# Patient Record
Sex: Female | Born: 1955 | Race: Black or African American | Hispanic: No | Smoking: Former smoker
Health system: Southern US, Community
[De-identification: ages and names within clinical notes are randomized; demographics above are authoritative.]

## PROBLEM LIST (undated history)

## (undated) DIAGNOSIS — I639 Cerebral infarction, unspecified: Secondary | ICD-10-CM

## (undated) DIAGNOSIS — E119 Type 2 diabetes mellitus without complications: Secondary | ICD-10-CM

## (undated) DIAGNOSIS — I10 Essential (primary) hypertension: Secondary | ICD-10-CM

## (undated) HISTORY — PX: TUBAL LIGATION: SHX77

---

## 2008-12-09 ENCOUNTER — Inpatient Hospital Stay: Payer: Self-pay | Admitting: Internal Medicine

## 2009-01-05 ENCOUNTER — Ambulatory Visit: Payer: Self-pay | Admitting: Internal Medicine

## 2009-01-12 ENCOUNTER — Other Ambulatory Visit: Payer: Self-pay | Admitting: Internal Medicine

## 2009-01-19 ENCOUNTER — Ambulatory Visit: Payer: Self-pay | Admitting: Internal Medicine

## 2009-09-08 ENCOUNTER — Emergency Department: Payer: Self-pay | Admitting: Emergency Medicine

## 2013-12-19 ENCOUNTER — Inpatient Hospital Stay: Payer: Self-pay | Admitting: Internal Medicine

## 2013-12-19 LAB — COMPREHENSIVE METABOLIC PANEL
ALT: 26 U/L
Albumin: 3.6 g/dL (ref 3.4–5.0)
Alkaline Phosphatase: 107 U/L
Anion Gap: 4 — ABNORMAL LOW (ref 7–16)
BILIRUBIN TOTAL: 0.3 mg/dL (ref 0.2–1.0)
BUN: 9 mg/dL (ref 7–18)
CO2: 29 mmol/L (ref 21–32)
Calcium, Total: 8.9 mg/dL (ref 8.5–10.1)
Chloride: 107 mmol/L (ref 98–107)
Creatinine: 0.78 mg/dL (ref 0.60–1.30)
EGFR (African American): >60
EGFR (Non-African Amer.): >60
GLUCOSE: 138 mg/dL — AB (ref 65–99)
Osmolality: 280 (ref 275–301)
Potassium: 4.2 mmol/L (ref 3.5–5.1)
SGOT(AST): 25 U/L (ref 15–37)
Sodium: 140 mmol/L (ref 136–145)
TOTAL PROTEIN: 7.9 g/dL (ref 6.4–8.2)

## 2013-12-19 LAB — URINALYSIS, COMPLETE
Bilirubin,UR: NEGATIVE
Glucose,UR: NEGATIVE mg/dL (ref 0–75)
Hyaline Cast: 5
KETONE: NEGATIVE
Nitrite: NEGATIVE
PH: 5 (ref 4.5–8.0)
PROTEIN: NEGATIVE
RBC,UR: 45 /HPF (ref 0–5)
Specific Gravity: 1.02 (ref 1.003–1.030)
Squamous Epithelial: 17

## 2013-12-19 LAB — CBC
HCT: 40.3 % (ref 35.0–47.0)
HGB: 13.2 g/dL (ref 12.0–16.0)
MCH: 31.9 pg (ref 26.0–34.0)
MCHC: 32.6 g/dL (ref 32.0–36.0)
MCV: 98 fL (ref 80–100)
Platelet: 197 10*3/uL (ref 150–440)
RBC: 4.12 10*6/uL (ref 3.80–5.20)
RDW: 13.4 % (ref 11.5–14.5)
WBC: 5.2 10*3/uL (ref 3.6–11.0)

## 2013-12-19 LAB — TROPONIN I: Troponin-I: <0.02 ng/mL

## 2013-12-20 ENCOUNTER — Ambulatory Visit: Payer: Self-pay | Admitting: Neurology

## 2013-12-20 LAB — CBC WITH DIFFERENTIAL/PLATELET
Basophil #: 0.1 10*3/uL (ref 0.0–0.1)
Basophil %: 1.3 %
EOS ABS: 0.1 10*3/uL (ref 0.0–0.7)
EOS PCT: 1.7 %
HCT: 36 % (ref 35.0–47.0)
HGB: 11.9 g/dL — ABNORMAL LOW (ref 12.0–16.0)
LYMPHS ABS: 1.6 10*3/uL (ref 1.0–3.6)
LYMPHS PCT: 30.5 %
MCH: 31.9 pg (ref 26.0–34.0)
MCHC: 33 g/dL (ref 32.0–36.0)
MCV: 97 fL (ref 80–100)
Monocyte #: 0.6 x10 3/mm (ref 0.2–0.9)
Monocyte %: 10.4 %
NEUTROS PCT: 56.1 %
Neutrophil #: 3 10*3/uL (ref 1.4–6.5)
Platelet: 186 10*3/uL (ref 150–440)
RBC: 3.72 10*6/uL — AB (ref 3.80–5.20)
RDW: 13.5 % (ref 11.5–14.5)
WBC: 5.4 10*3/uL (ref 3.6–11.0)

## 2013-12-20 LAB — LIPID PANEL
Cholesterol: 203 mg/dL — ABNORMAL HIGH (ref 0–200)
HDL Cholesterol: 41 mg/dL (ref 40–60)
Ldl Cholesterol, Calc: 135 mg/dL — ABNORMAL HIGH (ref 0–100)
Triglycerides: 134 mg/dL (ref 0–200)
VLDL CHOLESTEROL, CALC: 27 mg/dL (ref 5–40)

## 2013-12-20 LAB — BASIC METABOLIC PANEL
Anion Gap: 8 (ref 7–16)
BUN: 9 mg/dL (ref 7–18)
CALCIUM: 8.6 mg/dL (ref 8.5–10.1)
CO2: 27 mmol/L (ref 21–32)
Chloride: 107 mmol/L (ref 98–107)
Creatinine: 0.77 mg/dL (ref 0.60–1.30)
EGFR (African American): >60
EGFR (Non-African Amer.): >60
GLUCOSE: 144 mg/dL — AB (ref 65–99)
OSMOLALITY: 284 (ref 275–301)
POTASSIUM: 3.5 mmol/L (ref 3.5–5.1)
SODIUM: 142 mmol/L (ref 136–145)

## 2013-12-20 LAB — TSH: Thyroid Stimulating Horm: 2.4 u[IU]/mL

## 2013-12-20 LAB — HEMOGLOBIN A1C: HEMOGLOBIN A1C: 7.6 % — AB (ref 4.2–6.3)

## 2013-12-21 LAB — CBC WITH DIFFERENTIAL/PLATELET
Basophil #: 0.1 10*3/uL (ref 0.0–0.1)
Basophil %: 0.9 %
Eosinophil #: 0.1 10*3/uL (ref 0.0–0.7)
Eosinophil %: 1.2 %
HCT: 37.9 % (ref 35.0–47.0)
HGB: 12.3 g/dL (ref 12.0–16.0)
LYMPHS ABS: 1.4 10*3/uL (ref 1.0–3.6)
Lymphocyte %: 24.3 %
MCH: 31.7 pg (ref 26.0–34.0)
MCHC: 32.5 g/dL (ref 32.0–36.0)
MCV: 98 fL (ref 80–100)
MONOS PCT: 10.7 %
Monocyte #: 0.6 x10 3/mm (ref 0.2–0.9)
Neutrophil #: 3.7 10*3/uL (ref 1.4–6.5)
Neutrophil %: 62.9 %
Platelet: 185 10*3/uL (ref 150–440)
RBC: 3.89 10*6/uL (ref 3.80–5.20)
RDW: 13.8 % (ref 11.5–14.5)
WBC: 5.8 10*3/uL (ref 3.6–11.0)

## 2013-12-21 LAB — BASIC METABOLIC PANEL
Anion Gap: 8 (ref 7–16)
BUN: 12 mg/dL (ref 7–18)
Calcium, Total: 8.5 mg/dL (ref 8.5–10.1)
Chloride: 110 mmol/L — ABNORMAL HIGH (ref 98–107)
Co2: 24 mmol/L (ref 21–32)
Creatinine: 0.77 mg/dL (ref 0.60–1.30)
EGFR (Non-African Amer.): >60
Glucose: 137 mg/dL — ABNORMAL HIGH (ref 65–99)
Osmolality: 285 (ref 275–301)
POTASSIUM: 3.8 mmol/L (ref 3.5–5.1)
Sodium: 142 mmol/L (ref 136–145)

## 2013-12-21 LAB — URINE CULTURE

## 2013-12-21 LAB — CLOSTRIDIUM DIFFICILE(ARMC)

## 2013-12-24 LAB — CULTURE, BLOOD (SINGLE)

## 2014-01-03 DIAGNOSIS — R4701 Aphasia: Secondary | ICD-10-CM | POA: Insufficient documentation

## 2014-01-03 DIAGNOSIS — R531 Weakness: Secondary | ICD-10-CM | POA: Insufficient documentation

## 2014-01-27 ENCOUNTER — Encounter: Payer: Self-pay | Admitting: Nurse Practitioner

## 2014-02-21 ENCOUNTER — Encounter: Payer: Self-pay | Admitting: Nurse Practitioner

## 2014-03-24 ENCOUNTER — Encounter: Payer: Self-pay | Admitting: Nurse Practitioner

## 2014-04-22 ENCOUNTER — Encounter
Admit: 2014-04-22 | Disposition: A | Discharge status: Home / Self Care | Payer: Self-pay | Attending: Nurse Practitioner | Admitting: Nurse Practitioner

## 2014-05-23 ENCOUNTER — Encounter
Admit: 2014-05-23 | Disposition: A | Discharge status: Home / Self Care | Payer: Self-pay | Attending: Nurse Practitioner | Admitting: Nurse Practitioner

## 2014-05-26 ENCOUNTER — Emergency Department
Admit: 2014-05-26 | Disposition: A | Discharge status: Home / Self Care | Payer: Self-pay | Admitting: Emergency Medicine

## 2014-05-26 LAB — COMPREHENSIVE METABOLIC PANEL
Albumin: 3.7 g/dL
Alkaline Phosphatase: 102 U/L
Anion Gap: 7 (ref 7–16)
BUN: 22 mg/dL — ABNORMAL HIGH
Bilirubin,Total: 0.4 mg/dL
CALCIUM: 9.1 mg/dL
CO2: 27 mmol/L
Chloride: 104 mmol/L
Creatinine: 0.7 mg/dL
Glucose: 107 mg/dL — ABNORMAL HIGH
Potassium: 3.6 mmol/L
SGOT(AST): 27 U/L
SGPT (ALT): 27 U/L
SODIUM: 138 mmol/L
Total Protein: 7.2 g/dL

## 2014-05-26 LAB — CBC WITH DIFFERENTIAL/PLATELET
BASOS ABS: 0 10*3/uL (ref 0.0–0.1)
BASOS PCT: 0.7 %
EOS ABS: 0.1 10*3/uL (ref 0.0–0.7)
Eosinophil %: 1.4 %
HCT: 34.4 % — ABNORMAL LOW (ref 35.0–47.0)
HGB: 11.7 g/dL — ABNORMAL LOW (ref 12.0–16.0)
LYMPHS PCT: 30.9 %
Lymphocyte #: 1.9 10*3/uL (ref 1.0–3.6)
MCH: 31.9 pg (ref 26.0–34.0)
MCHC: 33.8 g/dL (ref 32.0–36.0)
MCV: 94 fL (ref 80–100)
MONO ABS: 0.9 x10 3/mm (ref 0.2–0.9)
Monocyte %: 14.6 %
NEUTROS PCT: 52.4 %
Neutrophil #: 3.2 10*3/uL (ref 1.4–6.5)
PLATELETS: 204 10*3/uL (ref 150–440)
RBC: 3.65 10*6/uL — ABNORMAL LOW (ref 3.80–5.20)
RDW: 14.5 % (ref 11.5–14.5)
WBC: 6 10*3/uL (ref 3.6–11.0)

## 2014-05-26 LAB — TSH: THYROID STIMULATING HORM: 1.155 u[IU]/mL

## 2014-06-14 NOTE — Consult Note (Signed)
PATIENT NAME:  Terri Burnett, Terri M MR#:  [id number] DATE OF BIRTH:  1955-12-22  DATE OF CONSULTATION:  12/20/2013  REFERRING PHYSICIAN:    CONSULTING PHYSICIAN:  Pauletta BrownsYuriy Ryliegh Mcduffey, MD  REASON FOR CONSULTATION: Stroke.   HISTORY OF PRESENT ILLNESS: This is a 59 year old female with past medical history of hypertension, hyperlipidemia, noncompliant with her medications for the past year; as per patient she is noncompliant with medications because she states she did not think she needs them.   Admitted to the Emergency Department with right upper extremity, right lower extremity weakness that has improved; the patient is status post imaging. She has left lentiform nucleus and the left frontal lobe infarct. The patient was also found to have significant stenosis of the left M1.   PAST MEDICAL HISTORY: Diabetes, history of TIA, anemia, hypertension, history of fibroids, history of hyperlipidemia.   PAST SURGICAL HISTORY: None.   ALLERGIES: NONE.   MEDICATIONS: None.   SOCIAL HISTORY: Smokes about 1/2 pack a day.   REVIEW OF SYSTEMS: No shortness of breath. No abdominal pain. No visual changes. No chest pain. No heat or cold intolerance. No diarrhea or constipation; positive weakness on the right side. No anxiety or depression.   PHYSICAL EXAMINATION: VITAL SIGNS: Include a temperature of 98.9, pulse 77, respirations 18, blood pressure 177/108.  NEUROLOGIC: The patient is alert, awake, oriented to time, place, location and the reason why she is in the hospital. Facial sensation intact; speech appears to be fluent. Tongue is midline. Uvula elevates symmetrically. Shoulder shrug is intact. Motor strength examination, the patient does have a drift on the right upper extremity, which is 4+/5, and slightly on the right lower extremity is 4+/5, as this patient has significantly improved. Coordination, finger-to-nose intact, sensation is intact, reflexes diminished throughout, gait could not be  assessed.   IMPRESSION: A 59 year old female with past medical history of hypertension, hyperlipidemia, noncompliant with her medications, presenting with right upper and right lower extremity weakness, found to have left lentiform nucleus and left frontal lobe infarct.   PLAN: Continue the statin 40 mg daily. The patient is currently, aspirin 81 mg due to proximal left M1 stenosis, based on Sammpris trial, agree with dual antiplatelet therapy for 90 days, aspirin 81 mg, and Plavix 75. After 90 days patient should just continue on 1 agent of Plavix 75 mg a day; if she is able to afford it, if she has insurance; otherwise will consider just on aspirin 325 daily. This case discussed with the patient and the patient's nieces at bedside, rest of the stroke work-up as ordered.   Thank you. It was a pleasure seeing this patient. Please call with any questions.     ____________________________ Pauletta BrownsYuriy Matha Masse, MD yz:nt D: 12/20/2013 17:06:08 ET T: 12/20/2013 19:08:24 ET JOB#: [id number]  cc: Pauletta BrownsYuriy Tiersa Dayley, MD, <Dictator> Pauletta BrownsYURIY Alixandria Friedt MD ELECTRONICALLY SIGNED 12/25/2013 12:31

## 2014-06-14 NOTE — Discharge Summary (Signed)
PATIENT NAME:  Terri Burnett, Terri Burnett MR#:  [id number] DATE OF BIRTH:  08/05/1955  DATE OF ADMISSION:  12/19/2013 DATE OF DISCHARGE:  12/23/2013  DISCHARGE DIAGNOSES: 1.  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  2.  Intracranial artery stenosis.  3.  Hyperlipidemia.   SECONDARY DIAGNOSES: 1.  Diabetes.  2.  Hypertension.  3.  History of transient ischemic attack.  4.  Anemia.  5.  History of fibroids.  6.  Hyperlipidemia.   CONSULTATIONS:   1.  Neurology, Dr. Loretha Brasil.  2.  Physical and occupational therapy.   PROCEDURES AND RADIOLOGY:  CT scan of the head without contrast on October 29 showed acute to subacute infarct involving the left basal ganglia and anterior limb of the internal capsule. No evidence of hemorrhage or mass effect. Old lacunar infarct involving the right thalamus. Old left cerebellar infarct also noted.   MRI of the brain without contrast on October 30 showed high-grade stenosis of the distal left M1 segment corresponding to the areas of infection. High-grade stenosis or occlusion of the left A1 segment. Occluded left vertebral artery. Narrowing of the distal basilar artery of less than 50%. High-grade stenosis of the left P2 segment. Asymmetric attenuation of the left MCA branch vessels suggestive of significant hemodynamic effect of the left M1 stenosis. Moderate diffuse small vessel disease.   Acute nonhemorrhagic infarct involving the anterior lentiform nucleus and anterior left frontal cortex. Remote infarct in the left cerebellum and bilateral thalami.   Bilateral carotid Dopplers on October 30 showed soft plaque within the distal left common carotid artery. No focal hemodynamically significant stenosis.   A 2-D echocardiogram on October 30 showed LVEF of 60 to 65%. Impaired relaxation pattern of LV diastolic filling. Mild LVH. Mild increase in left ventricular posterior wall thickness.   MAJOR LABORATORY PANEL:   Urinalysis on admission showed 1+ bacteria, 89 WBCs, 3+ leukocyte esterase, and WBC in clumps. Blood cultures x 2 were negative on October 29. Urine culture was negative. Stool for Clostridium difficile was negative.   HISTORY AND SHORT HOSPITAL COURSE:  The patient is a 59 year old female with the above-mentioned medical problems, who was admitted for unsteady gait and confusion, worrisome for acute CVA. Please see Dr. Wilburt Finlay dictated history and physical for further details. The patient underwent MRI of the brain, which showed acute CVA. Neurologic consultation was obtained with Dr. Loretha Brasil, who recommended continuing statin along with aspirin and Plavix with dual antiplatelet therapy for 90 days followed by only Plavix. Physical and occupational therapy consultation was obtained, and the patient was recommended home with home health services, which were set up. The patient was discharged on November 2 in stable condition. She did have some residual speech disturbances and confusion. On the date of discharge, her vital signs are as follows:  Temperature 98, heart rate 70 per minute, respirations 16 per minute, blood pressure 126/88. She was saturating 93% on room air.   PERTINENT PHYSICAL EXAMINATION ON THE DATE OF DISCHARGE:  CARDIOVASCULAR:  S1, S2 normal. No murmurs, rales, or gallop.  LUNGS:  Clear to auscultation bilaterally. No wheezing, rales, rhonchi, or crepitation.  ABDOMEN:  Soft, benign.  NEUROLOGIC:  The patient has a drift in the right upper extremity, 4+/5, and some minimal speech disturbances, but otherwise the patient was alert and oriented with no residual deficit and nonfocal examination.   All other physical examination remained at baseline.   DISCHARGE MEDICATIONS: 1.  Metformin 1000 mg p.o. b.i.d.  2.  Aspirin 81 mg p.o. daily.  3.  Plavix 75 mg p.o. daily.  4.  Atorvastatin 40 mg p.o. daily.  5.  Nicotine 14 mg patch transdermally daily.  6.  Prinivil 5 mg p.o.  daily.   DISCHARGE DIET:  Low sodium, low fat, low cholesterol, 1800 calories, ADA.   DISCHARGE ACTIVITY:  As tolerated.   DISCHARGE INSTRUCTIONS AND FOLLOWUP:  The patient was instructed to follow up with her primary care physician, Dr. Toy CookeyErnest Eason, in 2 to 4 weeks. [facility] in 1 to 2 weeks. She was set up to get home health physical and occupational therapy along with speech therapy services.   TOTAL TIME DISCHARGING THIS PATIENT:  45 minutes.   Please note that she remains at a very high risk for readmission.    ____________________________ Ellamae SiaVipul S. Sherryll BurgerShah, MD vss:nb D: 12/27/2013 06:12:34 ET T: 12/27/2013 06:38:56 ET JOB#: [id number]  cc: Enid Maultsby S. Sherryll BurgerShah, MD, <Dictator> Serita ShellerErnest B. Maryellen PileEason, MD [facility] Pauletta BrownsYuriy Zeylikman, MD  Ellamae SiaVIPUL S [facility] MD ELECTRONICALLY SIGNED 12/27/2013 10:58

## 2014-06-14 NOTE — H&P (Signed)
PATIENT NAME:  Terri LaundryBRINCEFIELD, Terri M MR#:  [id number] DATE OF BIRTH:  1955/03/21  DATE OF ADMISSION:  12/19/2013  PRIMARY PHYSICIAN:   Toy CookeyErnest Eason, M.D.  REFERRING PHYSICIAN:   Governor Rooksebecca Lord, M.D.  CHIEF COMPLAINT:  Unsteady gait.  The patient acting not herself according to the family.  HISTORY OF PRESENT ILLNESS: The patient is a 59 year old African-American female with history of diabetes, hypertension, hyperlipidemia who is not taking any of her medications for the past year. Has not followed up with a doctor.  Is brought in by family because when her daughter was talking to her she was just staring into space and also has had difficulty with unsteady gait. The patient came to the ED with these symptoms and was noted to have acute CVA involving the basal ganglia and anterior  of the internal capsule. There were also old strokes noted. Therefore, we are asked to admit the patient. The patient is able to answer the questions and currently does not appear to be confused. Her blood pressure was elevated at 208/109.  She otherwise denies any fevers, chills. No chest pains or shortness of breath. No nausea or vomiting.   PAST MEDICAL HISTORY: 1.  Diabetes type 2.  2.  History of TIA.  3.  Anemia.  4.  Hypertension.  5.  History of fibroids in the past. 6.  History of hyperlipidemia.   PAST SURGICAL HISTORY:  None.   ALLERGIES: None.   MEDICATIONS: None.   SOCIAL HISTORY:  Smokes about a half pack per day. Drinks during the weekend 24 ounces of beer. No drug use.   FAMILY HISTORY: Positive for hypertension, CVA, diabetes.   REVIEW OF SYSTEMS: CONSTITUTIONAL: Denies any fevers. Complains of fatigue, weakness and weight loss.  No weight gain.  EYES: No blurred or double vision. No redness. No inflammation. Complains of swelling near the orbits.  ENT: No tinnitus. No ear pain. No hearing loss. No seasonal or year-round allergies. No difficulty swallowing.  RESPIRATORY: Denies any cough,  wheezing, hemoptysis. No COPD.  No TB.  CARDIOVASCULAR: Denies any chest pain, orthopnea, edema,  GASTROINTESTINAL: No nausea, vomiting, diarrhea. No abdominal pain. No hematemesis.  GENITOURINARY: Denies any dysuria, hematuria, renal calculus or frequency.  ENDOCRINE: Denies any polyuria, nocturia or thyroid problems.  HEMATOLOGIC AND LYMPHATIC: Denies any easy bruisability or bleeding.  SKIN: No acne. No rash.  MUSCULOSKELETAL: Denies any pain in the neck, back or shoulder.  NEUROLOGIC:  No CVA. No seizures.  PSYCHIATRIC: Denies any anxiety, insomnia or ADD.   PHYSICAL EXAMINATION: VITAL SIGNS: Temperature 99, pulse 84, respirations 18, blood pressure 208/109. O2 is 98%.  GENERAL: The patient is a morbidly obese, African-American female.   HEENT:  Atraumatic, normocephalic.  Pupils equally round, reactive to light and accommodation. There is no conjunctival pallor. No scleral icterus. Nasal exam shows no drainage or ulceration. Oropharynx is clear without any exudate.  Nasal exam shows no drainage or ulceration and also right ear exam shows no erythema or drainage.  NECK: Supple without any JVD. No thyromegaly. No carotid bruits.  CARDIOVASCULAR: Regular rate and rhythm. No murmurs, rubs, clicks or gallops.  LUNGS: Clear to auscultation bilaterally without any rales, rhonchi, wheezing.  ABDOMEN: Soft, nontender, nondistended. Positive bowel sounds x4. No hepatosplenomegaly.  SKIN: No rash.  LYMPH NODES: Nonpalpable.  VASCULAR: Good DP, PT pulses.  PSYCHIATRIC: Not anxious or depressed.  NEUROLOGICAL: She has right upper extremity weakness and right lower extremity weakness.  Strength is 4 out of 5 compared  to the other side.  Reflexes 2+. Babinski is downgoing.  PSYCHIATRIC: Not anxious or depressed.  SKIN: No rash.  MUSCULOSKELETAL: There is no erythema or swelling.   EVALUATIONS:  EKG - normal sinus rhythm without any ST-T wave changes.  LABORATORY DATA:  Glucose 138. BUN 9,  creatinine 0.78. Sodium 140, potassium 4.2, chloride 107, CO2 of 29, calcium 8.9. LFTs are normal. Troponin less than 0.02. WBC 5.2, hemoglobin 13.2, platelet count 197.   URINALYSIS:  3+ leukocytes, WBCs 89.   ASSESSMENT AND PLAN: The patient is 59 year old African-American female with a history of transient ischemic attack in the past.  Noncompliant with medications.  Presents with gait unsteadiness and noted to have acute cerebrovascular accident.   1.  Acute cerebrovascular accident with old multiple cerebrovascular accidents. At this time, we will get an MRI of the brain, MRA of the brain, echocardiogram, aspirin, neurology consult. May need further evaluation for embolic cerebrovascular accident as well.  2.  Hypertension with acute cerebrovascular accident. We will do p.r.n. hydralazine, but will allow permissive hypertension unless blood pressure is extremely elevated.  3.  Diabetes: Will do sliding scale insulin for now and check a hemoglobin A1c.  She will likely need some oral regimen.  4.  Hyperlipidemia: Check a fasting lipid panel.  Will start her on atorvastatin.  5.  Miscellaneous:  The patient will be on Lovenox for deep vein thrombosis prophylaxis.  6. Nicotine addiction:  Smoking cessation done, four minutes spent. Recommend the patient to stop smoking. She will be started on a nicotine patch.   Time spent is 35 minutes on this patient.      ____________________________ Lacie Scotts. Allena Katz, MD shp:jw D: 12/19/2013 17:47:11 ET T: 12/19/2013 19:58:48 ET JOB#: [id number]  cc: Shauntel Prest H. Allena Katz, MD, <Dictator> Charise Carwin MD ELECTRONICALLY SIGNED 01/08/2014 19:24

## 2014-06-22 NOTE — Consult Note (Signed)
PATIENT NAME:  Terri Burnett, Terri Burnett MR#:  [id number] DATE OF BIRTH:  September 15, 1955  DATE OF CONSULTATION:  05/26/2014  REFERRING PHYSICIAN:   CONSULTING PHYSICIAN:  Lakeysha Slutsky A. Allena Katz, MD  PRIMARY CARE PHYSICIAN: [facility].   CHIEF COMPLAINT: Slurred speech today.   HISTORY OF PRESENT ILLNESS: Terri Burnett is a 59 year old African-American female with history of CVA, diabetes, TIA, history of chronic anemia, comes in to the Emergency Room after she woke up and as the day passed along she was not feeling well, had some slurred speech while she was trying to talk with her daughter. She denied any focal weakness. Denied any dysphagia or any vision changes. Given her history of stroke she was brought to the Emergency Room where a CT of the head was done which was essentially negative. Her symptoms had resolved completely, she felt back to baseline, and requested to go home. Given her history of significant CVA in the past an MRI of the brain was done which was essentially negative for acute CVA other than old infarcts present as before. The patient is feeling better. The patient who be given some aspirin was ordered by me.   PAST MEDICAL HISTORY:  1. Hypertension.  2. Diabetes.  3. TIA.   4. History of chronic anemia.  5. Hyperlipidemia.   MEDICATIONS:  1. Metformin 1000 mg b.i.d.  2. Lisinopril 40 mg daily.  3. Hydrochlorothiazide 25 mg p.o. daily.  4. Plavix 75 mg daily.  5. Atorvastatin 40 mg daily.  6. Aspirin 81 mg daily.   ALLERGIES: No known drug allergies.   SOCIAL HISTORY: Smokes about half a pack a day. Drinks during the weekend. No drug use.   FAMILY HISTORY: Positive for hypertension, CVA, diabetes.   REVIEW OF SYSTEMS:    CONSTITUTIONAL: No fever, fatigue, weakness.  EYES: No blurred or double vision, redness, or inflammation.  EARS, NOSE, AND THROAT: No tinnitus, ear pain, hearing loss.  RESPIRATORY: No cough, wheeze, hemoptysis,  or COPD.   CARDIOVASCULAR: No chest  pain, orthopnea, edema.  GASTROINTESTINAL: No nausea, vomiting, diarrhea, abdominal pain.  GENITOURINARY: No dysuria, hematuria, or frequency.  ENDOCRINE: No polyuria, nocturia, or thyroid problems.  HEMATOLOGY: No anemia or easy bruising.  SKIN: No acne or rash.  MUSCULOSKELETAL: No swelling or gout. Positive for arthritis.  NEUROLOGIC: Positive for CVA in the past. Positive for dysarthria.   PSYCHIATRIC:  No anxiety, depression, or bipolar disorder. All other systems reviewed and negative.   PHYSICAL EXAMINATION:  GENERAL: The patient is awake, alert, oriented x 3, not in acute distress.  VITAL SIGNS: Afebrile. Pulse is 67, regular, blood pressure is 127/72, pulse oximetry is 99% on room air.  HEENT: Atraumatic, normocephalic. Pupils PERRLA.  EOM intact. Oral mucosa is moist.  NECK: Supple. No JVD. No carotid bruit.  LUNGS: Clear to auscultation bilaterally. No rales, rhonchi, respiratory distress, or labored breathing.  CARDIOVASCULAR: Both heart sounds are normal. Rate and rhythm regular. PMI not lateralized. Chest nontender.  EXTREMITIES: Good pedal pulses. Good femoral pulses. No lower extremity edema.  ABDOMEN: Soft, benign, nontender. No organomegaly. Positive bowel sounds.  NEUROLOGIC: Grossly intact cranial nerves II through XII. No motor or sensory deficit.  PSYCHIATRIC: The patient is awake, alert, oriented x 3.  SKIN: Warm and dry.   LABORATORY DATA:  MRI of the brain shows focal area of altered signal intensity right aspect of the pons on diffusion sequences, this is a level of artifact extending through the region which may be related to such,  however not exclude acute infarct on the right pontine. No evidence of other acute infarct noted. Remote left pontine infarct, remote left cerebellar infarct, remote moderate-sized left frontal lobe infarct. Left vertebral artery may be occluded or significantly narrowed. CBC within normal limits except H and H of 11.7 and 34.4. Glucose is  107, BUN is 22, the rest of the electrolytes within normal limits. TSH is 1.15.   EKG shows normal sinus rhythm, no acute ST-T changes.   ASSESSMENT: A 59 year old, Terri Burnett with a history of stroke in the remote past, hypertension, diabetes, hyperlipidemia, comes in with:   1.  Slurred speech, resolved now, likely secondary to transient ischemic attack. Workup in the Emergency Room is essentially unremarkable. Her MRI results were discussed with neurology on call. The patient will be continued on aspirin and Plavix. She is feeling at baseline, no neurologic deficits, her speech is clear, and she does not have any focal weakness. Blood pressure is within normal limits. Her home medications will be continued.  She will follow up with her primary care physician.  2.  Type 2 diabetes. Continue metformin.  3.  Hypertension. Continue lisinopril and hydrochlorothiazide.  4.  Hyperlipidemia. Continue atorvastatin.   The above was discussed with the patient and the patient's family members who voiced understanding. Healthy lifestyle was discussed with the patient and her family agreed to it.   TIME SPENT: 50 minutes.     ____________________________ Wylie HailSona A. Allena KatzPatel, MD sap:bu D: 05/26/2014 19:26:33 ET T: 05/26/2014 19:56:15 ET JOB#: [id number]  cc: Darrious Youman A. Allena KatzPatel, MD, <Dictator> [facility] Willow OraSONA A Carnelia Oscar MD ELECTRONICALLY SIGNED 05/27/2014 16:00

## 2014-06-26 ENCOUNTER — Encounter: Payer: Self-pay | Admitting: Speech Pathology

## 2014-07-03 ENCOUNTER — Ambulatory Visit: Payer: Medicare Other | Admitting: Speech Pathology

## 2014-07-10 ENCOUNTER — Encounter: Payer: Self-pay | Admitting: Speech Pathology

## 2014-07-17 ENCOUNTER — Encounter: Payer: Self-pay | Admitting: Speech Pathology

## 2015-06-24 ENCOUNTER — Other Ambulatory Visit: Payer: Self-pay | Admitting: Nurse Practitioner

## 2015-06-24 DIAGNOSIS — Z1231 Encounter for screening mammogram for malignant neoplasm of breast: Secondary | ICD-10-CM

## 2015-09-22 DIAGNOSIS — M25569 Pain in unspecified knee: Secondary | ICD-10-CM | POA: Insufficient documentation

## 2016-02-25 ENCOUNTER — Emergency Department (HOSPITAL_COMMUNITY): Payer: Medicare Other

## 2016-02-25 ENCOUNTER — Observation Stay (HOSPITAL_COMMUNITY)
Admission: EM | Admit: 2016-02-25 | Discharge: 2016-02-27 | Disposition: A | Discharge status: Home Health | Payer: Medicare Other | Attending: Oncology | Admitting: Oncology

## 2016-02-25 ENCOUNTER — Observation Stay (HOSPITAL_COMMUNITY): Payer: Medicare Other

## 2016-02-25 ENCOUNTER — Encounter (HOSPITAL_COMMUNITY): Payer: Self-pay | Admitting: Emergency Medicine

## 2016-02-25 DIAGNOSIS — Z79899 Other long term (current) drug therapy: Secondary | ICD-10-CM | POA: Insufficient documentation

## 2016-02-25 DIAGNOSIS — R4701 Aphasia: Secondary | ICD-10-CM

## 2016-02-25 DIAGNOSIS — E119 Type 2 diabetes mellitus without complications: Secondary | ICD-10-CM | POA: Insufficient documentation

## 2016-02-25 DIAGNOSIS — S01512A Laceration without foreign body of oral cavity, initial encounter: Secondary | ICD-10-CM | POA: Insufficient documentation

## 2016-02-25 DIAGNOSIS — Z7984 Long term (current) use of oral hypoglycemic drugs: Secondary | ICD-10-CM | POA: Insufficient documentation

## 2016-02-25 DIAGNOSIS — F17210 Nicotine dependence, cigarettes, uncomplicated: Secondary | ICD-10-CM | POA: Diagnosis not present

## 2016-02-25 DIAGNOSIS — Y92000 Kitchen of unspecified non-institutional (private) residence as  the place of occurrence of the external cause: Secondary | ICD-10-CM | POA: Insufficient documentation

## 2016-02-25 DIAGNOSIS — Z7902 Long term (current) use of antithrombotics/antiplatelets: Secondary | ICD-10-CM | POA: Diagnosis not present

## 2016-02-25 DIAGNOSIS — R2990 Unspecified symptoms and signs involving the nervous system: Secondary | ICD-10-CM | POA: Diagnosis present

## 2016-02-25 DIAGNOSIS — I10 Essential (primary) hypertension: Secondary | ICD-10-CM | POA: Diagnosis not present

## 2016-02-25 DIAGNOSIS — I69354 Hemiplegia and hemiparesis following cerebral infarction affecting left non-dominant side: Secondary | ICD-10-CM | POA: Diagnosis not present

## 2016-02-25 DIAGNOSIS — W19XXXA Unspecified fall, initial encounter: Secondary | ICD-10-CM | POA: Insufficient documentation

## 2016-02-25 DIAGNOSIS — Z7982 Long term (current) use of aspirin: Secondary | ICD-10-CM | POA: Diagnosis not present

## 2016-02-25 DIAGNOSIS — R55 Syncope and collapse: Secondary | ICD-10-CM | POA: Diagnosis not present

## 2016-02-25 DIAGNOSIS — I6789 Other cerebrovascular disease: Secondary | ICD-10-CM

## 2016-02-25 DIAGNOSIS — R569 Unspecified convulsions: Secondary | ICD-10-CM | POA: Diagnosis not present

## 2016-02-25 DIAGNOSIS — E785 Hyperlipidemia, unspecified: Secondary | ICD-10-CM | POA: Insufficient documentation

## 2016-02-25 DIAGNOSIS — R4182 Altered mental status, unspecified: Secondary | ICD-10-CM | POA: Diagnosis present

## 2016-02-25 HISTORY — DX: Essential (primary) hypertension: I10

## 2016-02-25 HISTORY — DX: Cerebral infarction, unspecified: I63.9

## 2016-02-25 HISTORY — DX: Type 2 diabetes mellitus without complications: E11.9

## 2016-02-25 LAB — CBC
HEMATOCRIT: 37 % (ref 36.0–46.0)
HEMOGLOBIN: 11.8 g/dL — AB (ref 12.0–15.0)
MCH: 31 pg (ref 26.0–34.0)
MCHC: 31.9 g/dL (ref 30.0–36.0)
MCV: 97.1 fL (ref 78.0–100.0)
Platelets: 198 10*3/uL (ref 150–400)
RBC: 3.81 MIL/uL — ABNORMAL LOW (ref 3.87–5.11)
RDW: 13.6 % (ref 11.5–15.5)
WBC: 7.6 10*3/uL (ref 4.0–10.5)

## 2016-02-25 LAB — URINALYSIS, ROUTINE W REFLEX MICROSCOPIC
Bilirubin Urine: NEGATIVE
Glucose, UA: NEGATIVE mg/dL
Hgb urine dipstick: NEGATIVE
Ketones, ur: NEGATIVE mg/dL
Nitrite: NEGATIVE
PROTEIN: NEGATIVE mg/dL
SPECIFIC GRAVITY, URINE: 1.011 (ref 1.005–1.030)
pH: 6 (ref 5.0–8.0)

## 2016-02-25 LAB — DIFFERENTIAL
Basophils Absolute: 0 10*3/uL (ref 0.0–0.1)
Basophils Relative: 0 %
Eosinophils Absolute: 0.1 10*3/uL (ref 0.0–0.7)
Eosinophils Relative: 1 %
Lymphocytes Relative: 26 %
Lymphs Abs: 2 10*3/uL (ref 0.7–4.0)
Monocytes Absolute: 0.5 10*3/uL (ref 0.1–1.0)
Monocytes Relative: 6 %
Neutro Abs: 5 10*3/uL (ref 1.7–7.7)
Neutrophils Relative %: 67 %

## 2016-02-25 LAB — I-STAT CHEM 8, ED
BUN: 16 mg/dL (ref 6–20)
CALCIUM ION: 1.07 mmol/L — AB (ref 1.15–1.40)
Chloride: 107 mmol/L (ref 101–111)
Creatinine, Ser: 1 mg/dL (ref 0.44–1.00)
GLUCOSE: 170 mg/dL — AB (ref 65–99)
HCT: 37 % (ref 36.0–46.0)
Hemoglobin: 12.6 g/dL (ref 12.0–15.0)
Potassium: 3.8 mmol/L (ref 3.5–5.1)
SODIUM: 141 mmol/L (ref 135–145)
TCO2: 24 mmol/L (ref 0–100)

## 2016-02-25 LAB — COMPREHENSIVE METABOLIC PANEL
ALK PHOS: 134 U/L — AB (ref 38–126)
ALT: 12 U/L — ABNORMAL LOW (ref 14–54)
ANION GAP: 11 (ref 5–15)
AST: 22 U/L (ref 15–41)
Albumin: 3.7 g/dL (ref 3.5–5.0)
BILIRUBIN TOTAL: 0.1 mg/dL — AB (ref 0.3–1.2)
BUN: 14 mg/dL (ref 6–20)
CALCIUM: 9.2 mg/dL (ref 8.9–10.3)
CO2: 23 mmol/L (ref 22–32)
Chloride: 107 mmol/L (ref 101–111)
Creatinine, Ser: 0.98 mg/dL (ref 0.44–1.00)
Glucose, Bld: 169 mg/dL — ABNORMAL HIGH (ref 65–99)
POTASSIUM: 3.9 mmol/L (ref 3.5–5.1)
Sodium: 141 mmol/L (ref 135–145)
TOTAL PROTEIN: 7.6 g/dL (ref 6.5–8.1)

## 2016-02-25 LAB — I-STAT TROPONIN, ED: TROPONIN I, POC: 0.02 ng/mL (ref 0.00–0.08)

## 2016-02-25 LAB — CBG MONITORING, ED: GLUCOSE-CAPILLARY: 138 mg/dL — AB (ref 65–99)

## 2016-02-25 MED ORDER — ACETAMINOPHEN 650 MG RE SUPP: 650.0000 mg | Freq: Four times a day (QID) | RECTAL | Status: DC | PRN

## 2016-02-25 MED ORDER — ACETAMINOPHEN 325 MG PO TABS
650.0000 mg | ORAL_TABLET | Freq: Four times a day (QID) | ORAL | Status: DC | PRN
  Administered 2016-02-26: 650 mg via ORAL
  Filled 2016-02-25: qty 2

## 2016-02-25 MED ORDER — CLOPIDOGREL BISULFATE 75 MG PO TABS
75.0000 mg | ORAL_TABLET | Freq: Every day | ORAL | Status: DC
  Administered 2016-02-26 – 2016-02-27 (×2): 75 mg via ORAL
  Filled 2016-02-25 (×2): qty 1

## 2016-02-25 MED ORDER — ATORVASTATIN CALCIUM 40 MG PO TABS
40.0000 mg | ORAL_TABLET | Freq: Every day | ORAL | Status: DC
  Administered 2016-02-26: 40 mg via ORAL
  Filled 2016-02-25: qty 1

## 2016-02-25 MED ORDER — ENOXAPARIN SODIUM 40 MG/0.4ML ~~LOC~~ SOLN
40.0000 mg | SUBCUTANEOUS | Status: DC
  Administered 2016-02-25 – 2016-02-26 (×2): 40 mg via SUBCUTANEOUS
  Filled 2016-02-25 (×2): qty 0.4

## 2016-02-25 MED ORDER — IOPAMIDOL (ISOVUE-370) INJECTION 76%
INTRAVENOUS | Status: AC
  Administered 2016-02-25: 50 mL via INTRAVENOUS
  Filled 2016-02-25: qty 50

## 2016-02-25 MED ORDER — ASPIRIN EC 81 MG PO TBEC
81.0000 mg | DELAYED_RELEASE_TABLET | Freq: Every day | ORAL | Status: DC
  Administered 2016-02-26 – 2016-02-27 (×2): 81 mg via ORAL
  Filled 2016-02-25 (×2): qty 1

## 2016-02-25 NOTE — H&P (Signed)
Date: 02/25/2016               Patient Name:  Terri Burnett MRN: [id number]  DOB: November 18, 1955 Age / Sex: 61 y.o., female   PCP: Ermalene Searing, MD         Medical Service: Internal Medicine Teaching Service         Attending Physician: Dr. Annia Belt, MD    First Contact: Dr. Hetty Ely Pager: [phone]  Second Contact: Dr. Juleen China Pager: [phone]       After Hours (After 5p/  First Contact Pager: [phone]  weekends / holidays): Second Contact Pager: [phone]   Chief Complaint: syncope  History of Present Illness:  Terri Burnett is a 61yo female with PMH of CVA, TIA's, HTN, HLD, and T2DMpresenting after a fall at home. Patient does not remember incident so history is provided by family. Daughter states that patient was her usual self this morning, and was in the kitchen with granddaughter when she fell. Daughter was at patient's side within a few seconds and noted that she was breathing deeply, did not appear aware and was not responding. Patient states that she has not had a recent illness, no chest pain, shortness of breath, headache, vision or hearing changes, dysuria, hematuria, abdominal pain, nausea, vomiting, diarrhea or constipation, melena or hematochezia, chest pain or shortness of breath. Family states that when she finally started speaking, she was dysarthric very similarly to how she presented at her prior stroke which left her with dysarthria and left sided weakness (almost back to baseline before today with help of therapy).   EMS was called who called code stroke due to left sided weakness and dysarthria. On arrival to ED, patient was found to have left facial droop and dysarthria. Neuro was consulted and did not think patient had stroke, rather a possible seizure.   Meds:  Current Meds  Medication Sig  . amLODipine (NORVASC) 10 MG tablet Take 10 mg by mouth daily.  Marland Kitchen aspirin EC 81 MG tablet Take 81 mg by mouth daily.  Marland Kitchen atorvastatin (LIPITOR) 40 MG tablet  Take 40 mg by mouth daily.  . clopidogrel (PLAVIX) 75 MG tablet Take 75 mg by mouth daily.  Marland Kitchen lisinopril (PRINIVIL,ZESTRIL) 5 MG tablet Take 5 mg by mouth daily.  Marland Kitchen losartan (COZAAR) 100 MG tablet Take 100 mg by mouth daily.  . metFORMIN (GLUCOPHAGE-XR) 500 MG 24 hr tablet Take 500 mg by mouth daily with breakfast.    Allergies: Allergies as of 02/25/2016  . (No Known Allergies)   Past Medical History:  Diagnosis Date  . Diabetes mellitus without complication ([place])   . Hypertension   . Stroke [facility])     Family History: Family history of HTN, stroke and DM  Social History: Patient smokes 0.5ppd, no alcohol or drug use. Lives with daughter and her family  Review of Systems: A complete ROS was negative except as per HPI.   Physical Exam: Blood pressure (!) 168/77, pulse (!) 105, temperature 99.5 F (37.5 C), temperature source Oral, resp. rate 18, height '5\' 6"'$  (1.676 m), weight 214 lb 12.8 oz (97.4 kg), SpO2 100 %. General: alert, well-developed, and cooperative to examination.  Head: normocephalic and atraumatic; tenderness in occipital region - no laceration or ecchymosis appreciated.  Eyes: vision grossly intact, pupils equal, pupils round, pupils reactive to light, no injection and anicteric.  Mouth: pharynx pink and moist, no erythema, and no exudates. Laceration on left side of tongue Neck: supple, full  ROM, no thyromegaly, no JVD.  Lungs: normal respiratory effort, no accessory muscle use, normal breath sounds, no crackles, and no wheezes. Heart: normal rate, regular rhythm, Systolic ejection murmur best heard at LUSB, no gallop, and no rub.  Abdomen: soft, non-tender, normal bowel sounds, no distention, no guarding, no rebound tenderness.  Msk: no joint swelling, no joint warmth, and no redness over joints.  Pulses: 2+ DP/PT pulses bilaterally Extremities: No cyanosis, clubbing, edema Neurologic: alert & oriented X3, Dysarthric otherwise cranial nerves II-XII intact,  strength normal in all extremities, sensation intact to light touch.  Skin: turgor normal and no rashes.  Psych: Oriented X3, normally interactive, good eye contact, not anxious appearing  LABS: Na 141, K 3.9, Cl 107, CO2 23, BUN 14, Cr 0.98, Glu 169 AST 22, ALT 12, Alk phos 134 WBC 7.6, Hgb 11.8, Hct 37, Plts 198 iStat trop negative UA - large leukocytes, rare bacteria, squamous cells, 5-30 RBCs, 6-30 WBC  CT head: chronic left MCA territory ischemia with progression of white matter involvement since 2016; chronic ischemia in right thalamus and left cerebellum. No acute infarct or hemorrhage appreciated.  EEG: Normal without seizure activity.   Assessment & Plan by Problem: Active Problems:   Stroke-like symptoms  TIA vs seizure: Patient with history of CVA and TIA's presented after a fall and development of dysarthria and left sided weakness - associated with tongue biting, event amnesia, and period of unawareness. No history of arrhythmia and RRR on exam. Patient evaluated by neuro in ED and though more likely seizure, patient with rapid improvement; on our evaluation, strength intact and just mild dysarthria. EEG negative but unable to exclude seizure. CT negative for acute infarct or hemorrhage. --telemetry --MRI brain per neuro; if negative, suggest no further stroke work up --neuro following - appreciate their recc's --passed bedside swallow eval --seizure precautions  HTN: Patient on home regimen of amlodipine '10mg'$  daily, losartan '100mg'$  daily. --holding as patient normotensive and in setting of possible CVA  HLD: Patient on home regimen of atorvastatin '40mg'$  daily and Plavix '75mg'$  daily. --Continue atorvastatin and plavix  T2DM: Patient on home regimen of metformin '500mg'$  daily. Last A1C in system on 12/20/2013 at 7.6.  --SSI - s while inpatient  DVT: lovenox IVF: none Diet: soft Code: FULL   Dispo: Admit patient to Observation with expected length of stay less than 2  midnights.  Signed: Alphonzo Grieve, MD 02/25/2016, 7:50 PM  Pager [phone]

## 2016-02-25 NOTE — Code Documentation (Signed)
61[facility] at [facility].  Patient from home where she reportedly "passed out" in the bathroom and was noted by EMS to have slurred speech.  Of note, patient with h/o stroke 2 years ago.  Code stroke activated by EMS.  Patient to CT on arrival.  Stroke team to the bedside.  CT completed.  NIHSS 2, see documentation for details and code stroke times.  Patient with left facial droop and dysarthria on exam.  Patient with laceration to left tongue on exam.  Symptoms improving and seizure suspected.  Dr. Amada JupiterKirkpatrick at the bedside.  Code stroke canceled.  Bedside handoff with ED RN Kennyth ArnoldStacy.

## 2016-02-25 NOTE — Consult Note (Signed)
Neurology Consultation Reason for Consult: Code stroke Referring Physician: Shanna CiscoWard, Jamie  CC: Loss of consciousness  History is obtained from: Family  HPI: Terri Burnett is a 61 y.o. female who presents with transient episode of loss of consciousness followed by aphasia. Her daughters heard a thump and then when he found her, she was staring blankly ahead, would not fixate or track. She did not have any clear shaking activity. En route with the EMS, she began fixating and tracking but remained aphasic. By the time of arrival to the ER, she would say 1 or 2 words still with some difficulty. She continued rapidly improving and was able to interact much more readily by the time we were back from CT.  The patient reports a single previous episode, but she did not report it to anyone.   LKW: 1:30 p.m. tpa given?: no, rapidly improving symptoms   ROS: A 14 point ROS was performed and is negative except as noted in the HPI.   Past Medical History:  Diagnosis Date  . Diabetes mellitus without complication (H[facility])   . Hypertension   . Stroke [facility](H[facility])      History reviewed. No pertinent family history.   Social History:  reports that she has never smoked. She has never used smokeless tobacco. She reports that she does not drink alcohol or use drugs.   Exam: Current vital signs: BP (!) 168/77 (BP Location: Left Arm)   Pulse (!) 105   Temp 99.5 F (37.5 C) (Oral)   Resp 18   Ht 5\' 6"  (1.676 m)   Wt 97.4 kg (214 lb 12.8 oz)   LMP  (LMP Unknown)   SpO2 100%   BMI 34.67 kg/m  Vital signs in last 24 hours: Temp:  [98.1 F (36.7 C)-99.5 F (37.5 C)] 99.5 F (37.5 C) (01/04 1800) Pulse Rate:  [92-105] 105 (01/04 1800) Resp:  [17-20] 18 (01/04 1800) BP: (132-168)/(77-135) 168/77 (01/04 1800) SpO2:  [95 %-100 %] 100 % (01/04 1800) Weight:  [95.6 kg (210 lb 12.2 oz)-97.4 kg (214 lb 12.8 oz)] 97.4 kg (214 lb 12.8 oz) (01/04 1800)   Physical Exam  Constitutional: Appears  well-developed and well-nourished.  Psych: Affect appropriate to situation Eyes: No scleral injection HENT: Laceration on the anterior lateral aspect of her tongue Head: Normocephalic.  Cardiovascular: Normal rate and regular rhythm.  Respiratory: Effort normal and breath sounds normal to anterior ascultation GI: Soft.  No distension. There is no tenderness.  Skin: WDI  Neuro: Mental Status: Patient initially appears to have a moderate aphasia, but begins speaking much more fluently and is able to name objects and repeat. Cranial Nerves: II: Visual Fields are full. Pupils are equal, round, and reactive to light.   III,IV, VI: EOMI without ptosis or diploplia.  V: Facial sensation is symmetric to temperature VII: Facial movement is questionable for mild left facial droop VIII: hearing is intact to voice X: Uvula elevates symmetrically XI: Shoulder shrug is symmetric. XII: tongue is midline without atrophy or fasciculations.  Motor: Tone is normal. Bulk is normal. 5/5 strength was present in all four extremities.  Sensory: Sensation is symmetric to light touch and temperature in the arms and legs. Deep Tendon Reflexes: 2+ and symmetric in the biceps and patellae.  Cerebellar: FNF and HKS are intact bilaterally  I have reviewed labs in epic and the results pertinent to this consultation are: CMP-unremarkable  I have reviewed the images obtained: CT head-old cortical infarct on the left  Impression:  61 year old female with old cortical infarct in the left. She resents with loss of consciousness with gradual return to baseline and tongue biting concerning for possible seizure activity. Though not definite, the description of loss of consciousness with amnestic period, open eyes and staring, and tongue biting are all more suggestive of seizure rather than TIA or stroke. I would favor getting an MRI, but if this is negative then I would not pursue further stroke workup.    Recommendations: 1) MRI brain 2) EEG 3) neurology will follow   Ritta Slot, MD [facility] [phone]  If 7pm- 7am, please page neurology on call as listed in AMION.

## 2016-02-25 NOTE — Procedures (Signed)
History: 61 year old female with episode concerning for seizure  Sedation: None  Technique: This is a 21 channel routine scalp EEG performed at the bedside with bipolar and monopolar montages arranged in accordance to the international 10/20 system of electrode placement. One channel was dedicated to EKG recording.    Background: The background consists of intermixed alpha and beta activities. There is a well defined posterior dominant rhythm of 10 Hz that attenuates with eye opening. Sleep is not recorded.   Photic stimulation: Physiologic driving is not performed  EEG Abnormalities: None  Clinical Interpretation: This normal EEG is recorded in the waking state. There was no seizure or seizure predisposition recorded on this study. Please note that a normal EEG does not preclude the possibility of epilepsy.   Ritta SlotMcNeill Chadley Dziedzic, MD [facility] [phone]  If 7pm- 7am, please page neurology on call as listed in AMION.

## 2016-02-25 NOTE — ED Provider Notes (Signed)
[facility]EMERGENCY DEPT Provider Note   CSN: [id number] Arrival date & time: 02/25/16  1430   An emergency department physician performed an initial assessment on this suspected stroke patient at [id number].  History   Chief Complaint No chief complaint on file.   HPI Terri Burnett is a 61 y.o. female.  The history is provided by the patient and medical records. No language interpreter was used.   Terri Burnett is a 61 y.o. female  who presents to the Emergency Department for evaluation following a ? syncopal episode just prior to arrival with concerns for stroke. Family at bedside state that patient was in her usual state of health this morning. She was in the kitchen with granddaughter (approx. 315-62 years old) who witnessed patient suddenly fall to the ground. Family state when they came in, she was not able to get her words out and was not speaking to them. She was having some left-sided weakness as well. Patient complaining of posterior head pain, but otherwise no complaints at this time. She states that she feels like her symptoms are improving. Patient's daughters are at the bedside and states that she had a prior stroke 2 years ago which left her with slurred speech and left-sided weakness. She underwent speech and physical therapy which drastically improved her symptoms. She is ambulatory, independent with very minimal left-sided weakness at baseline, almost completely recovered from prior stroke. She has also had 2-3 TIA like episodes per family in the last year.    No past medical history on file.  There are no active problems to display for this patient.   No past surgical history on file.  OB History    No data available       Home Medications    Prior to Admission medications   Not on File    Family History No family history on file.  Social History Social History  Substance Use Topics  . Smoking status: Not on file  . Smokeless tobacco: Not on file   . Alcohol use Not on file     Allergies   Patient has no allergy information on record.   Review of Systems Review of Systems  Constitutional: Negative for fever.  HENT: Negative for congestion.   Eyes: Negative for visual disturbance.  Respiratory: Negative for shortness of breath.   Cardiovascular: Negative for chest pain.  Gastrointestinal: Negative for abdominal pain.  Musculoskeletal: Negative for back pain and neck pain.  Skin: Negative for wound.  Neurological: Positive for speech difficulty and headaches. Negative for dizziness.  Hematological: Does not bruise/bleed easily.     Physical Exam Updated Vital Signs BP 155/95 (BP Location: Left Arm)   Pulse 98   Temp 98.7 F (37.1 C) (Oral)   Resp 18   Wt 95.6 kg   SpO2 95%   Physical Exam  Constitutional: She appears well-developed and well-nourished.  HENT:  Head: Normocephalic.  1cm lac to left front aspect of the tongue, but otherwise atraumatic.  Neck:  No midline tenderness.   Cardiovascular: Normal rate, regular rhythm and normal heart sounds.   No murmur heard. Pulmonary/Chest: Effort normal and breath sounds normal. No respiratory distress.  Abdominal: Soft. She exhibits no distension. There is no tenderness.  Musculoskeletal: She exhibits no edema.  Neurological:  Alert, oriented to person and place. Patient is slow to respond to questions, but is able to answer questions appropriately. Follows commands with no difficulty.  Cranial Nerves:  II:  Peripheral visual fields grossly  normal, pupils equal, round, reactive to light III, IV, VI: EOM intact bilaterally, ptosis not present V,VII: smile symmetric, eyes kept closed tightly against resistance, facial light touch sensation equal VIII: hearing grossly normal IX, X: symmetric soft palate movement, uvula elevates symmetrically  XI: bilateral shoulder shrug symmetric and strong XII: midline tongue extension Able to move all extremities with equal  strength. No drift.  Normal finger-to-nose and rapid alternating movements.  Skin: Skin is warm and dry.  Nursing note and vitals reviewed.    ED Treatments / Results  Labs (all labs ordered are listed, but only abnormal results are displayed) Labs Reviewed  CBC - Abnormal; Notable for the following:       Result Value   RBC 3.81 (*)    Hemoglobin 11.8 (*)    All other components within normal limits  I-STAT CHEM 8, ED - Abnormal; Notable for the following:    Glucose, Bld 170 (*)    Calcium, Ion 1.07 (*)    All other components within normal limits  DIFFERENTIAL  COMPREHENSIVE METABOLIC PANEL  I-STAT TROPOININ, ED  CBG MONITORING, ED    EKG  EKG Interpretation None       Radiology Ct Head Code Stroke W/o Cm  Result Date: 02/25/2016 CLINICAL DATA:  Code stroke. 61 y/o female with fall and aphasia. Initial encounter. EXAM: CT HEAD WITHOUT CONTRAST TECHNIQUE: Contiguous axial images were obtained from the base of the skull through the vertex without intravenous contrast. COMPARISON:  [facility] head CT without contrast 05/26/2014 and earlier. FINDINGS: Brain: Sequelae of the 2015 anterior division left MCA infarct affecting the left inferior frontal gyrus and white-matter about the left frontal horn. The white matter hypodensity has progressed since 2016. Associated ex vacuo enlargement of the left frontal horn. No definite superimposed new cytotoxic edema. Stable chronic posterior left cerebellar infarct. Chronic right thalamic lacune. No acute intracranial hemorrhage identified. No midline shift, mass effect, or evidence of intracranial mass lesion. No ventriculomegaly. Vascular: Calcified atherosclerosis at the skull base. Skull: Stable.  No acute osseous abnormality identified. Sinuses/Orbits: Visualized paranasal sinuses and mastoids are stable and well pneumatized. Other: No acute orbit or scalp soft tissue findings. ASPECTS [facility] Stroke Program Early  CT Score) - Ganglionic level infarction (caudate, lentiform nuclei, internal capsule, insula, M1-M3 cortex): 7 - Supraganglionic infarction (M4-M6 cortex): 3 Total score (0-10 with 10 being normal): 10 IMPRESSION: 1. Chronic anterior division left MCA territory ischemia with progression of white matter involvement since 2016. No acute cortically based infarct is evident. No intracranial hemorrhage or mass effect. 2. ASPECTS is 10. 3. Stable chronic ischemia in the right thalamus and left cerebellum since 2016. Electronically Signed   By: Odessa Fleming M.D.   On: 02/25/2016 15:29    Procedures Procedures (including critical care time)  Medications Ordered in ED Medications  iopamidol (ISOVUE-370) 76 % injection (50 mLs Intravenous Contrast Given 02/25/16 1454)     Initial Impression / Assessment and Plan / ED Course  I have reviewed the triage vital signs and the nursing notes.  Pertinent labs & imaging results that were available during my care of the patient were reviewed by me and considered in my medical decision making (see chart for details).  Clinical Course    MYCAH MCDOUGALL is a 61 y.o. female who presents to ED for stroke like symptoms vs. possible syncopal episode. Code stroke initiated and visit today in conjunction with neurology team. Patient had syncopal episode associated with change  in speech and possible left-sided weakness. Hx of prior stroke 2 years ago at outside facility. Symptoms nearly resolved on my examination. Family state only remaining symptom is that patient is much slower to respond to questions than usual. Afebrile and vital signs stable. No midline c-spine tenderness. CT negative for acute changes. Neurology recommends MR brain and EEG: symptoms possibly 2/2 seizure. Stroke workup only if MR concerning for stroke with medical obs admission. IM teaching service consulted who will admit.    Final Clinical Impressions(s) / ED Diagnoses   Final diagnoses:    Aphasia  Aphasia    New Prescriptions New Prescriptions   No medications on file     [facility] Francies Inch, PA-C 02/25/16 1804    Marily Memos, MD 02/26/16 [phone]

## 2016-02-25 NOTE — Progress Notes (Signed)
EEG Completed; Results Pending  

## 2016-02-25 NOTE — ED Notes (Signed)
Called 5C several times with no one answering calls.  Operator is Research officer, trade unionsending security.

## 2016-02-26 DIAGNOSIS — I10 Essential (primary) hypertension: Secondary | ICD-10-CM

## 2016-02-26 DIAGNOSIS — S01512A Laceration without foreign body of oral cavity, initial encounter: Secondary | ICD-10-CM

## 2016-02-26 DIAGNOSIS — Z79899 Other long term (current) drug therapy: Secondary | ICD-10-CM | POA: Diagnosis not present

## 2016-02-26 DIAGNOSIS — Z7902 Long term (current) use of antithrombotics/antiplatelets: Secondary | ICD-10-CM | POA: Diagnosis not present

## 2016-02-26 DIAGNOSIS — E785 Hyperlipidemia, unspecified: Secondary | ICD-10-CM

## 2016-02-26 DIAGNOSIS — R55 Syncope and collapse: Secondary | ICD-10-CM | POA: Diagnosis not present

## 2016-02-26 DIAGNOSIS — Z7984 Long term (current) use of oral hypoglycemic drugs: Secondary | ICD-10-CM | POA: Diagnosis not present

## 2016-02-26 DIAGNOSIS — F17210 Nicotine dependence, cigarettes, uncomplicated: Secondary | ICD-10-CM

## 2016-02-26 DIAGNOSIS — E119 Type 2 diabetes mellitus without complications: Secondary | ICD-10-CM

## 2016-02-26 DIAGNOSIS — R4701 Aphasia: Secondary | ICD-10-CM | POA: Diagnosis not present

## 2016-02-26 DIAGNOSIS — R569 Unspecified convulsions: Secondary | ICD-10-CM

## 2016-02-26 DIAGNOSIS — W19XXXA Unspecified fall, initial encounter: Secondary | ICD-10-CM

## 2016-02-26 DIAGNOSIS — Z833 Family history of diabetes mellitus: Secondary | ICD-10-CM

## 2016-02-26 DIAGNOSIS — Z8249 Family history of ischemic heart disease and other diseases of the circulatory system: Secondary | ICD-10-CM

## 2016-02-26 DIAGNOSIS — Z823 Family history of stroke: Secondary | ICD-10-CM | POA: Diagnosis not present

## 2016-02-26 DIAGNOSIS — Z8673 Personal history of transient ischemic attack (TIA), and cerebral infarction without residual deficits: Secondary | ICD-10-CM

## 2016-02-26 DIAGNOSIS — I6789 Other cerebrovascular disease: Secondary | ICD-10-CM

## 2016-02-26 LAB — GLUCOSE, CAPILLARY
GLUCOSE-CAPILLARY: 139 mg/dL — AB (ref 65–99)
GLUCOSE-CAPILLARY: 97 mg/dL (ref 65–99)
GLUCOSE-CAPILLARY: 147 mg/dL — AB (ref 65–99)
Glucose-Capillary: 101 mg/dL — ABNORMAL HIGH (ref 65–99)

## 2016-02-26 MED ORDER — LEVETIRACETAM 500 MG PO TABS
500.0000 mg | ORAL_TABLET | Freq: Two times a day (BID) | ORAL | Status: DC
  Administered 2016-02-26 – 2016-02-27 (×3): 500 mg via ORAL
  Filled 2016-02-26 (×3): qty 1

## 2016-02-26 MED ORDER — INSULIN ASPART 100 UNIT/ML ~~LOC~~ SOLN
0.0000 [IU] | Freq: Three times a day (TID) | SUBCUTANEOUS | Status: DC
  Administered 2016-02-26 – 2016-02-27 (×2): 2 [IU] via SUBCUTANEOUS

## 2016-02-26 NOTE — Progress Notes (Signed)
Subjective: No further events  Exam: Vitals:   02/26/16 1300 02/26/16 1700  BP: 126/78 133/85  Pulse: 81 78  Resp: 20 16  Temp: 98.7 F (37.1 C) 98.6 F (37 C)   Gen: In bed, NAD Resp: non-labored breathing, no acute distress Abd: soft, nt  Neuro: MS: Awake, alert, interactive and appropriate. She has some mild dysarthria due to her tongue CN: Pupils equal round and reactive to light, extraocular movements intact Motor: She has good strength bilaterally. Sensory: Intact light touch  Pertinent Labs: CMP-unremarkable  Impression: 61 year old female with old cortical infarct in the left. She resents with loss of consciousness with gradual return to baseline and tongue biting concerning for possible seizure activity. Though not definite, the description of loss of consciousness with amnestic period, open eyes and staring, and tongue biting are all more suggestive of seizure than syncope, but may be reasonable to get an echo.  I do think with this description, I would favor starting an antiepileptic  Empirically, though she continues to have spells, I'm not sure I would favor aggressive escalation of dosing or adding other antiepileptics without more definitive diagnosis either by history or testing.  Recommendations: 1) echo  2) Keppra 500 mg twice a day 3) Patient is unable to drive, operate heavy machinery, perform activities at heights or participate in water activities until release by outpatient physician. This was discussed with the patient who expressed understanding.  4) no further recommendations at this time, neurology will sign off, please call with further questions or concerns.  Ritta SlotMcNeill Layken Doenges, MD [facility] [phone]  If 7pm- 7am, please page neurology on call as listed in AMION.

## 2016-02-26 NOTE — Progress Notes (Signed)
  Subjective: Ms. Lamar Laundryriscilla M Karnik says that she is feeling herself today except for her tongue hurting where she bit it. She denies pain or weakness. Her granddaughter states that her overnight and did not notice any episodes of shaking or change in neuro status.  Objective:  Vital signs in last 24 hours: Vitals:   02/26/16 0600 02/26/16 1012 02/26/16 1300 02/26/16 1700  BP: 137/80 131/80 126/78 133/85  Pulse: 75 76 81 78  Resp: 18 20 20 16   Temp: 98.2 F (36.8 C) 98.5 F (36.9 C) 98.7 F (37.1 C) 98.6 F (37 C)  TempSrc: Oral Oral Oral Oral  SpO2: 98% 99% 99% 99%  Weight:      Height:       Physical Exam  Constitutional: She is oriented to person, place, and time. She appears well-developed and well-nourished. No distress.  HENT:  Head: Normocephalic and atraumatic.  Mouth/Throat: No oropharyngeal exudate.  Laceration over anterior left tongue  Eyes: Conjunctivae are normal. No scleral icterus.  Cardiovascular: Normal rate and regular rhythm.   No murmur heard. Pulmonary/Chest: Effort normal and breath sounds normal. No respiratory distress.  Abdominal: Soft. Bowel sounds are normal. She exhibits no distension. There is no tenderness.  Neurological: She is alert and oriented to person, place, and time. No cranial nerve deficit.  She has intact strength throughout except for decreased left hand strength. Intact reflexes and cerebellar movements.  Skin: Skin is warm and dry. She is not diaphoretic.  Psychiatric: She has a normal mood and affect. Her behavior is normal.    Assessment/Plan: Pt is a 61 y.o. yo female with a PMHx of HTN, HLD, Type 2 DM, and CVA who was admitted on 02/25/2016 after an episode of loss of consciousness.   Active Problems:   Stroke-like symptoms   Altered mental status   Aphasia   Seizures (HCC)   Benign essential HTN   Acute, but ill-defined, cerebrovascular disease  Loss of consciousness  Presented after an episode of loss of  conciousness. She was with her granddaughter in the kitchen when she collapsed and was found on the floor with her tongue bleeding. She was found to be less responsive with slurred speech and could not remember the episode and did not have bowel or bladder incontinence or any shaking movements. MRI and CT were negative for acute abnormality but showed old left sided infarct. Working diagnosis at this time is seizure secondary to brain scaring from past stroke with post ictal state vs. TIA vs. Cardiogenic syncope. Spoke with Dr. Amada JupiterKirkpatrick over the phone, he recommended keppra 500 mg BID, echocardiogram, and outpatient follow up with neuorology.  - follow up echo  - ordered Keppra 500 mg BID  - will have outpatient  follow up with [facility]  -will order home health PT   HTN  Continue home medications amlodipine 10 mg qd, losartan 100 mg qd   HLD  Continue home medications atorvastatin 40 mg daily and plavix 75 mg qd.   T2DM  -continue sensitive ISS and CBG   Dispo: Anticipated discharge afterECHO today or tomorrow  Eulah PontNina Josselin Gaulin, MD 02/26/2016, 5:45 PM Pager: [phone]

## 2016-02-26 NOTE — Care Management Obs Status (Signed)
MEDICARE OBSERVATION STATUS NOTIFICATION   Patient Details  Name: Terri Laundryriscilla M Isaac MRN: [id number] Date of Birth: 01/29/1956   Medicare Observation Status Notification Given:  Yes    Kermit BaloKelli F Jennfier Abdulla, RN 02/26/2016, 7:47 PM

## 2016-02-26 NOTE — Evaluation (Signed)
Physical Therapy Evaluation Patient Details Name: Terri Burnett MRN: [id number] DOB: 14-Jan-1956 Today's Date: 02/26/2016   History of Present Illness  Patient is a 61 y/o female with hx of CVA, HTN, DM presents with syncopal episode at home. Found to have left sided weakness and dysarthria. Neurology concerned about seizure.  Clinical Impression  Patient presents with generalized weakness, baseline cognitive deficits from prior CVA and imbalance impacting mobility. Pt independent PTA and cares for granddaughter. Pt tolerated gait training with use of RW for support. Recommend use of RW for home. Pt lives with daughter at home. Would benefit from HHPT so pt maximize independence and mobility and return to PLOF. Will follow acutely. Plan for stair training tomorrow prior to dc.    Follow Up Recommendations Home health PT;Supervision - Intermittent    Equipment Recommendations  Rolling walker with 5" wheels    Recommendations for Other Services OT consult     Precautions / Restrictions Precautions Precautions: Fall Restrictions Weight Bearing Restrictions: No      Mobility  Bed Mobility Overal bed mobility: Needs Assistance Bed Mobility: Supine to Sit;Sit to Supine     Supine to sit: Supervision;HOB elevated Sit to supine: Supervision   General bed mobility comments: Use of rail but no assist needed.   Transfers Overall transfer level: Needs assistance Equipment used: Rolling walker (2 wheeled) Transfers: Sit to/from Stand Sit to Stand: Min guard         General transfer comment: Min guard for safety. Stood from [employer], from low toilet x1. Dizziness present - resolved.   Ambulation/Gait Ambulation/Gait assistance: Min guard Ambulation Distance (Feet): 120 Feet Assistive device: Rolling walker (2 wheeled) Gait Pattern/deviations: Step-through pattern;Decreased stride length Gait velocity: decreased Gait velocity interpretation: <1.8 ft/sec, indicative of  risk for recurrent falls General Gait Details: Slow, steady gait with use of RW. Cues for RW management.   Stairs            Wheelchair Mobility    Modified Rankin (Stroke Patients Only) Modified Rankin (Stroke Patients Only) Pre-Morbid Rankin Score: Slight disability Modified Rankin: Moderately severe disability     Balance Overall balance assessment: Needs assistance Sitting-balance support: Feet supported;No upper extremity supported Sitting balance-Leahy Scale: Good Sitting balance - Comments: Able to perform pericare without diffculty or LOB.   Standing balance support: During functional activity Standing balance-Leahy Scale: Fair Standing balance comment: Able to stand statically without UE support but requires UE support for dynamic standing.                             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     Hand Dominance        Extremity/Trunk Assessment   Upper Extremity Assessment Upper Extremity Assessment: Defer to OT evaluation    Lower Extremity Assessment Lower Extremity Assessment: Generalized weakness (with residual weakness from prior CVA but functional.)       Communication   Communication: Expressive difficulties  Cognition Arousal/Alertness: Awake/alert Behavior During Therapy: [facility] for  tasks assessed/performed Overall Cognitive Status: History of cognitive impairments - at baseline Area of Impairment: Problem solving              Problem Solving: Slow processing;Requires verbal cues General Comments: Delayed response time to questions asked.     General Comments General comments (skin integrity, edema, etc.): Family present during session.     Exercises     Assessment/Plan    PT Assessment Patient needs continued PT services  PT Problem List Decreased mobility;Decreased balance;Decreased strength;Decreased activity tolerance;Decreased cognition;Decreased knowledge of use of DME          PT Treatment Interventions DME instruction;Therapeutic activities;Gait training;Patient/family education;Therapeutic exercise;Balance training;Stair training;Functional mobility training;Neuromuscular re-education    PT Goals (Current goals can be found in the Care Plan section)  Acute Rehab PT Goals Patient Stated Goal: to go home PT Goal Formulation: With patient Time For Goal Achievement: 03/11/16 Potential to Achieve Goals: Good    Frequency Min 4X/week   Barriers to discharge Decreased caregiver support daughter works during the day    Co-evaluation               End of Session Equipment Utilized During Treatment: Gait belt Activity Tolerance: Patient tolerated treatment well Patient left: in bed;with call bell/phone within reach;with family/visitor present Nurse Communication: Mobility status    Functional Assessment Tool Used: clinical judgment Functional Limitation: Mobility: Walking and moving around Mobility: Walking and Moving Around Current Status [phone](G8978): At least 1 percent but less than 20 percent impaired, limited or restricted Mobility: Walking and Moving Around Goal Status [phone](G8979): At least 1 percent but less than 20 percent impaired, limited or restricted    Time: 1500-1518 PT Time Calculation (min) (ACUTE ONLY): 18 min   Charges:   PT Evaluation $PT Eval Moderate Complexity: 1 Procedure     PT G Codes:   PT G-Codes **NOT FOR INPATIENT CLASS** Functional Assessment Tool Used:  clinical judgment Functional Limitation: Mobility: Walking and moving around Mobility: Walking and Moving Around Current Status ([id number](G8978): At least 1 percent but less than 20 percent impaired, limited or restricted Mobility: Walking and Moving Around Goal Status [phone](G8979): At least 1 percent but less than 20 percent impaired, limited or restricted    Zeah Germano A Tiea Manninen 02/26/2016, 3:25 PM  Mylo RedShauna Folasade Mooty, PT, DPT [phone]

## 2016-02-27 ENCOUNTER — Observation Stay (HOSPITAL_BASED_OUTPATIENT_CLINIC_OR_DEPARTMENT_OTHER): Payer: Medicare Other

## 2016-02-27 DIAGNOSIS — R55 Syncope and collapse: Secondary | ICD-10-CM

## 2016-02-27 LAB — URINE CULTURE

## 2016-02-27 LAB — ECHOCARDIOGRAM COMPLETE
Height: 66 in
Weight: 3436.8 oz

## 2016-02-27 LAB — GLUCOSE, CAPILLARY
GLUCOSE-CAPILLARY: 140 mg/dL — AB (ref 65–99)
Glucose-Capillary: 115 mg/dL — ABNORMAL HIGH (ref 65–99)

## 2016-02-27 MED ORDER — LEVETIRACETAM 500 MG PO TABS: 500.0000 mg | ORAL_TABLET | Freq: Two times a day (BID) | ORAL | 0 refills | Status: DC

## 2016-02-27 NOTE — Evaluation (Signed)
Occupational Therapy Evaluation Patient Details Name: Lamar Laundryriscilla M Lamphear MRN: [id number] DOB: 1955-10-31 Today's Date: 02/27/2016    History of Present Illness Patient is a 61 y/o female with hx of CVA, HTN, DM presents with syncopal episode at home. Found to have left sided weakness and dysarthria. Neurology concerned about seizure.   Clinical Impression   Pt reports she was independent with ADL PTA. Currently pt min guard for ADL and functional mobility with the exception of min assist for standing balance during LB ADL. Educated pt and family on encouraging functional independence and safe use of DME. Pt planning to d/c home with 24/7 supervision from family. Recommending HHOT for follow up to maximize independence and safety with ADL and functional mobility upon return home. Plan for pt to d/c home today; [facility]. Please re-consult if needs change. Thank you for this referral.    Follow Up Recommendations  Home health OT;Supervision/Assistance - 24 hour (initially)    Equipment Recommendations  3 in 1 bedside commode    Recommendations for Other Services       Precautions / Restrictions Precautions Precautions: Fall Restrictions Weight Bearing Restrictions: No      Mobility Bed Mobility Overal bed mobility: Needs Assistance Bed Mobility: Supine to Sit     Supine to sit: Supervision;HOB elevated     General bed mobility comments: Pt OOB upon arrival.  Transfers Overall transfer level: Needs assistance Equipment used: None Transfers: Sit to/from Stand Sit to Stand: Min guard         General transfer comment: Min guard for safety. Min assist for balance during ADL.    Balance Overall balance assessment: Needs assistance Sitting-balance support: Feet supported;No upper extremity supported Sitting balance-Leahy Scale: Good Sitting balance - Comments: Able to perform pericare without diffculty or LOB.   Standing balance  support: No upper extremity supported;During functional activity Standing balance-Leahy Scale: Fair Standing balance comment: Able to stand statically without UE support but requires UE support for dynamic standing.                            ADL Overall ADL's : Needs assistance/impaired Eating/Feeding: Set up;Sitting   Grooming: Set up;Supervision/safety;Sitting   Upper Body Bathing: Set up;Supervision/ safety;Sitting   Lower Body Bathing: Minimal assistance;Sit to/from stand Lower Body Bathing Details (indicate cue type and reason): for standing balance Upper Body Dressing : Minimal assistance;Sitting Upper Body Dressing Details (indicate cue type and reason): for bra Lower Body Dressing: Minimal assistance;Sit to/from stand Lower Body Dressing Details (indicate cue type and reason): for standing balance. pt able to don shoes and pants with increased time. Toilet Transfer: ImmunologistMin guard;RW Toilet Transfer Details (indicate cue type and reason): Educated on use of 3 in 1 over toilet. Simulated toilet transfer with transfer from chair       Tub/Shower Transfer Details (indicate cue type and reason): Educated on use of 3 in 1 in shower as a seat Functional mobility during ADLs: Min guard;Rolling walker General ADL Comments: Educated pt and family on encouraging functional independence with ADL, supervision for shower transfers and bathing, HHOT for follow up. Pt and family agreeable.     Vision     Perception     Praxis      Pertinent Vitals/Pain Pain Assessment: No/denies pain     Hand Dominance     Extremity/Trunk Assessment Upper Extremity Assessment Upper Extremity Assessment: Generalized weakness  Lower Extremity Assessment Lower Extremity Assessment: Defer to PT evaluation   Cervical / Trunk Assessment Cervical / Trunk Assessment: Normal   Communication Communication Communication: Expressive difficulties   Cognition Arousal/Alertness:  Awake/alert Behavior During Therapy: WFL for tasks assessed/performed Overall Cognitive Status: History of cognitive impairments - at baseline Area of Impairment: Problem solving             Problem Solving: Slow processing;Requires verbal cues General Comments: Delayed response time to questions asked.    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        OT Problem List:     OT Treatment/Interventions:      OT Goals(Current goals can be found in the care plan section) Acute Rehab OT Goals Patient Stated Goal: to go home OT Goal Formulation: All assessment and education complete, DC therapy  OT Frequency:     Barriers to D/C:            Co-evaluation              End of Session Nurse Communication: Mobility status;Other (comment) (HHOT and 3 in 1, pt ready for d/c)  Activity Tolerance: Patient tolerated treatment well Patient left: in chair;with call bell/phone within reach;with family/visitor present   Time: 1411-1430 OT Time Calculation (min): 19 min Charges:  OT General Charges $OT Visit: 1 Procedure OT Evaluation $OT Eval Moderate Complexity: 1 Procedure G-Codes: OT G-codes **NOT FOR INPATIENT CLASS** Functional Assessment Tool Used: Clinical judgement Functional Limitation: Self care Self Care Current Status (K2706): At least 1 percent but less than 20 percent  impaired, limited or restricted Self Care Goal Status (C3762): At least 1 percent but less than 20 percent impaired, limited or restricted Self Care Discharge Status [phone]): At least 1 percent but less than 20 percent impaired, limited or restricted   Gaye Alken M.S., OTR/L Pager: [phone]  02/27/2016, 2:52 PM

## 2016-02-27 NOTE — Care Management Note (Signed)
Case Management Note  Patient Details  Name: Lamar Laundryriscilla M Micalizzi MRN: [id number] Date of Birth: 09-Mar-1955  Subjective/Objective:                  syncopal episode Action/Plan: Discharge planning Expected Discharge Date:  02/27/16               Expected Discharge Plan:  Home w Home Health Services  In-House Referral:     Discharge planning Services  CM Consult  Post Acute Care Choice:  Home Health Choice offered to:  Patient  DME Arranged:  3-N-1, Walker rolling DME Agency:  [facility]  HH Arr[facility]  [facility]  [facility]  Status of Service:  Completed, signed off  If discussed at Long Length of Stay Meetings, dates discussed:    Additional Comments: CM spoke with pt and pt's granddaughter to offer choice of home health agency.  Family choose [facility] to render HHPT/OT.  [facility] Jermaine.  CM notified [facility] DME rep, Reggie to please deliver the rolling walker and 3n1 to room prior to discharge.   No other CM needs were communicated. Yves DillJeffries, Stormey Wilborn Christine, RN 02/27/2016, 3:07 PM

## 2016-02-27 NOTE — Progress Notes (Signed)
Clinical status and ancillary data reviewed with resident physician Dr. Gwynn BurlyAndrew Wallace following morning rounds. She appeared to be in stable condition. Vital signs all normal. She was alert and oriented. She was started on Keppra per neurology recommendations. Plan was to discharge the patient following scheduled echocardiogram which was done. This showed normal systolic function with ejection fraction 60-65 percent. Grade 1 diastolic dysfunction. Calcified aortic valve. No obvious PFO or ASD. When I entered the chart to complete documentation, the computer reported that this patient was deceased. This was totally unexpected. I called her daughter miss Marcina MillardDenise Woody and the patient is actually home and doing quite well. This is a gross entry error in the computer system which I will have corrected immediately.

## 2016-02-27 NOTE — Progress Notes (Signed)
OT Cancellation Note  Patient Details Name: Terri Burnett MRN: [id number] DOB: 1956/02/21   Cancelled Treatment:    Reason Eval/Treat Not Completed: Patient at procedure or test/ unavailable. Will follow up for OT eval as time allows.  Gaye AlkenBailey A Corrinne Benegas M.S., OTR/L Pager: [phone]  02/27/2016, 9:51 AM

## 2016-02-27 NOTE — Discharge Summary (Signed)
Name: Terri Burnett MRN: [id number] DOB: 1955/05/26 61 y.o. PCP: Rosiland Oz, MD  Date of Admission: 02/25/2016  2:30 PM Date of Discharge: 02/27/2016 Attending Physician: Levert Feinstein, MD  Discharge Diagnosis: 1. Loss of consciousness - likely seizure 2. HTN 3. HLD 4. Type 2 DM  Active Problems:   Stroke-like symptoms   Altered mental status   Aphasia   Seizures (HCC)   Benign essential HTN   Acute, but ill-defined, cerebrovascular disease   Discharge Medications: Allergies as of 02/27/2016   No Known Allergies     Medication List    STOP taking these medications   lisinopril 5 MG tablet Commonly known as:  PRINIVIL,ZESTRIL     TAKE these medications   amLODipine 10 MG tablet Commonly known as:  NORVASC Take 10 mg by mouth daily.   aspirin EC 81 MG tablet Take 81 mg by mouth daily.   atorvastatin 40 MG tablet Commonly known as:  LIPITOR Take 40 mg by mouth daily.   clopidogrel 75 MG tablet Commonly known as:  PLAVIX Take 75 mg by mouth daily.   levETIRAcetam 500 MG tablet Commonly known as:  KEPPRA Take 1 tablet (500 mg total) by mouth 2 (two) times daily.   losartan 100 MG tablet Commonly known as:  COZAAR Take 100 mg by mouth daily.   metFORMIN 500 MG 24 hr tablet Commonly known as:  GLUCOPHAGE-XR Take 500 mg by mouth daily with breakfast.            Durable Medical Equipment        Start     Ordered   02/27/16 1033  For home use only DME Walker rolling  Once    Question:  Patient needs a walker to treat with the following condition  Answer:  Seizures (HCC)   02/27/16 1033      Disposition and follow-up:   [name] was discharged from [facility] in Stable condition.  At the hospital follow up visit please address:  1.  Patient has follow up with neurology, has been taking Keppra per neurology recommendations and is tolerating, and if they have any further home health needs.    2.   Labs / imaging needed at time of follow-up: BMET, A1c  3.  Pending labs/ test needing follow-up: none  Follow-up Appointments: Follow-up Information    Herma Mering, MD Follow up.   Specialty:  Pediatrics Why:  hospital follow up Contact information: [facility] [place] [address] [place] [address] [phone]        [facility] Follow up.   Specialty:  Neurology Why:  hospital follow up.  They will call you to schedule appointment. Contact information: [address] [place] [address] [phone]          Hospital Course by problem list: Active Problems:   Stroke-like symptoms   Altered mental status   Aphasia   Seizures (HCC)   Benign essential HTN   Acute, but ill-defined, cerebrovascular disease   Loss of consciousness in setting of likely seizure Seen and evaluated by neurology in the hospital who felt, while symptoms not definite, seemed consistent with seizure activity.  She was started on Keppra 500mg  BID, arranged outpatient follow up with [facility] neurology, and received orders for home health PT based off their evaluation.  Continue aspirin and plavix with history of prior strokes.  HTN  Continue home medications amlodipine 10  mg qd, losartan 100 mg qd.  Lisinopril also listed as home medication.  This was discontinued.  HLD  Continue home medications atorvastatin 40 mg daily and plavix 75 mg qd.   T2DM  Continue metformin at discharge  Discharge Vitals:   BP 131/83 (BP Location: Left Arm)   Pulse 87   Temp 98.6 F (37 C) (Oral)   Resp 20   Ht 5\' 6"  (1.676 m)   Wt 214 lb 12.8 oz (97.4 kg)   LMP  (LMP Unknown)   SpO2 100%   BMI 34.67 kg/m   Pertinent Labs, Studies, and Procedures:  TTE - This showed normal systolic function with ejection fraction 60-65 percent. Grade 1 diastolic dysfunction. Calcified aortic valve. No obvious PFO or ASD  MRI Brain  w/o contrast: IMPRESSION: 1. No acute intracranial process identified. 2. Remote left MCA territory infarct, with additional remote infarcts involving the left basal ganglia, bilateral thalami, pons, and left cerebellar hemisphere as above. 3. Mild chronic microvascular ischemic disease.  CT Head w/o Contrast: IMPRESSION: 1. Chronic anterior division left MCA territory ischemia with progression of white matter involvement since 2016. No acute cortically based infarct is evident. No intracranial hemorrhage or mass effect. 2. ASPECTS is 10. 3. Stable chronic ischemia in the right thalamus and left cerebellum since 2016.  EEG: This normal EEG is recorded in the waking state. There was no seizure or seizure predisposition recorded on this study. Please note that a normal EEG does not preclude the possibility of epilepsy.    Discharge Instructions: Discharge Instructions    Ambulatory referral to Neurology    Complete by:  As directed    An appointment is requested in approximately: 2 weeks   Diet - low sodium heart healthy    Complete by:  As directed    Discharge instructions    Complete by:  As directed    Ms. Terri Burnett,  It was a pleasure taking care of you in the hospital.  We believe you suffered a seizure due to a consequence of your old strokes.  Our neurologist in the hospital have started a medication called Keppra to take twice per day to prevent future seizures.  You will follow up with them in the coming weeks to see how you are doing.  We have given you a 30 day supply of this medication so you should follow up prior to running out of this medication.  Please also follow up with your primary care doctor.  You have 2 blood pressure medications listed called losartan and lisinopril that act similarly and can cause elevation of your potassium.  Please discuss with your doctor which medication they would like you to take.  For now, we have continued your losartan.  Take  care.   Increase activity slowly    Complete by:  As directed       Signed: Gwynn BurlyAndrew Jataya Wann, DO 02/27/2016, 12:05 PM   Pager: [phone]

## 2016-02-27 NOTE — Progress Notes (Signed)
  Subjective: Ms. Lamar Laundryriscilla M Conlee was seen and examined this morning and has no new complaints.  There has been no report of recurrent episodes of loss of consciousness or AMS.  Objective:  Vital signs in last 24 hours: Vitals:   02/26/16 2138 02/27/16 0051 02/27/16 0547 02/27/16 0911  BP: (!) 149/86 (!) 148/84 (!) 155/82 131/83  Pulse: 87 83 83 87  Resp: 16 16 16 20   Temp: 98.6 F (37 C) 98.8 F (37.1 C) 99.1 F (37.3 C) 98.6 F (37 C)  TempSrc: Oral Oral Oral Oral  SpO2: 100% 98% 99% 100%  Weight:      Height:       Physical Exam  Constitutional: She is oriented to person, place, and time. She appears well-developed and well-nourished. No distress.  HENT:  Head: Normocephalic and atraumatic.  Mouth/Throat: No oropharyngeal exudate.  Laceration over anterior left tongue  Eyes: Conjunctivae and EOM are normal. Pupils are equal, round, and reactive to light.  Cardiovascular: Normal rate and regular rhythm.   No murmur heard. Pulmonary/Chest: Effort normal.  Neurological: She is alert and oriented to person, place, and time. No cranial nerve deficit.  Appropriately follows commands. Good strength bilaterally of upper and lower extremity, normal finger-to-nose testing.  ? Mild dysarthria and difficulty with tongue protrusion due to laceration and swelling of tongue.  Skin: Skin is warm and dry. She is not diaphoretic.  Psychiatric: She has a normal mood and affect. Her behavior is normal.    Assessment/Plan: Pt is a 61 y.o. yo female with a PMHx of HTN, HLD, Type 2 DM, and CVA who was admitted on 02/25/2016 after an episode of loss of consciousness.   Active Problems:   Stroke-like symptoms   Altered mental status   Aphasia   Seizures (HCC)   Benign essential HTN   Acute, but ill-defined, cerebrovascular disease  Loss of consciousness in setting of likely seizure Seen and evaluated by neurology in the hospital who felt, while symptoms not definite, seemed consistent  with seizure activity.  She is awaiting Echo this AM for completeness although symptoms seem less likely for cardiogenic source - follow up Echo - continue Keppra 500mg  BID [facility] - [facility] and rolling walker plus Face to Face   HTN  Continue home medications amlodipine 10 mg qd, losartan 100 mg qd   HLD  Continue home medications atorvastatin 40 mg daily and plavix 75 mg qd.   T2DM  -continue sensitive ISS and CBG   Dispo: Anticipated discharge after Echo today  Gwynn Burlyndrew Tyarra Nolton, DO 02/27/2016, 10:22 AM Pager: [phone]

## 2016-02-27 NOTE — Progress Notes (Signed)
Physical Therapy Treatment Patient Details Name: Terri Burnett MRN: [id number] DOB: 1955/09/29 Today's Date: 02/27/2016    History of Present Illness Patient is a 61 y/o female with hx of CVA, HTN, DM presents with syncopal episode at home. Found to have left sided weakness and dysarthria. Neurology concerned about seizure.    PT Comments    Pt progressing towards physical therapy goals. Was able to participate in stair training, however would benefit from practicing again with daughter for safety. Was able to ambulate fairly well with RW for support and close guard for balance support. Will require 3-in-1 in addition to RW for home. Will continue to follow.   Follow Up Recommendations  Home health PT;Supervision - Intermittent     Equipment Recommendations  Rolling walker with 5" wheels;3in1 (PT)    Recommendations for Other Services OT consult     Precautions / Restrictions Precautions Precautions: Fall Restrictions Weight Bearing Restrictions: No    Mobility  Bed Mobility Overal bed mobility: Needs Assistance Bed Mobility: Supine to Sit     Supine to sit: Supervision;HOB elevated     General bed mobility comments: HOB flat and no rails. Pt was able to transition to EOB without assistance.   Transfers Overall transfer level: Needs assistance Equipment used: Rolling walker (2 wheeled) Transfers: Sit to/from Stand Sit to Stand: Min guard         General transfer comment: Min guard for safety. Pt was able to power-up to full standing position without assistance, however close guard was provided for safety. Dizziness present and resolved after ~2 minutes.   Ambulation/Gait Ambulation/Gait assistance: Min guard Ambulation Distance (Feet): 200 Feet Assistive device: Rolling walker (2 wheeled) Gait Pattern/deviations: Step-through pattern;Decreased stride length Gait velocity: decreased Gait velocity interpretation: Below normal speed for  age/gender General Gait Details: Slow, steady gait with use of RW. Cues for RW management.    Stairs Stairs: Yes   Stair Management: No rails;Step to pattern;Forwards Number of Stairs: 3 General stair comments: Pt's daughter was present during session for education. PT and daughter assisted pt with stair training. LLE leading up, RLE leading down.   Wheelchair Mobility    Modified Rankin (Stroke Patients Only) Modified Rankin (Stroke Patients Only) Pre-Morbid Rankin Score: Slight disability Modified Rankin: Moderately severe disability     Balance Overall balance assessment: Needs assistance Sitting-balance support: Feet supported;No upper extremity supported Sitting balance-Leahy Scale: Good Sitting balance - Comments: Able to perform pericare without diffculty or LOB.   Standing balance support: During functional activity Standing balance-Leahy Scale: Fair Standing balance comment: Able to stand statically without UE support but requires UE support for dynamic standing.                    Cognition Arousal/Alertness: Awake/alert Behavior During Therapy: WFL for tasks assessed/performed Overall Cognitive Status: History of cognitive impairments - at baseline Area of Impairment: Problem solving             Problem Solving: Slow processing;Requires verbal cues General Comments: Delayed response time to questions asked.     Exercises      General Comments        Pertinent Vitals/Pain Pain Assessment: No/denies pain    Home Living                      Prior Function            PT Goals (current goals can now be found in the care plan  section) Acute Rehab PT Goals Patient Stated Goal: to go home PT Goal Formulation: With patient Time For Goal Achievement: 03/11/16 Potential to Achieve Goals: Good Progress towards PT goals: Progressing toward goals    Frequency    Min 4X/week      PT Plan Current plan remains appropriate     Co-evaluation             End of Session Equipment Utilized During Treatment: Gait belt Activity Tolerance: Patient tolerated treatment well Patient left: in bed;with call bell/phone within reach;with family/visitor present     Time: 5784-69621113-1147 PT Time Calculation (min) (ACUTE ONLY): 34 min  Charges:  $Gait Training: 23-37 mins                    G Codes:      Marylynn PearsonLaura D Terald Jump 02/27/2016, 2:24 PM   Conni SlipperLaura Liliauna Santoni, PT, DPT Acute Rehabilitation Services Pager: [phone]

## 2016-03-21 ENCOUNTER — Ambulatory Visit: Payer: Self-pay | Admitting: Diagnostic Neuroimaging

## 2016-03-22 ENCOUNTER — Encounter: Payer: Self-pay | Admitting: Diagnostic Neuroimaging

## 2016-04-01 ENCOUNTER — Other Ambulatory Visit: Payer: Self-pay | Admitting: Internal Medicine

## 2016-04-23 ENCOUNTER — Other Ambulatory Visit: Payer: Self-pay | Admitting: Internal Medicine

## 2016-12-06 ENCOUNTER — Encounter: Payer: Self-pay | Admitting: Emergency Medicine

## 2016-12-06 ENCOUNTER — Emergency Department: Payer: Medicare Other

## 2016-12-06 ENCOUNTER — Emergency Department
Admission: EM | Admit: 2016-12-06 | Discharge: 2016-12-06 | Disposition: A | Discharge status: Home / Self Care | Payer: Medicare Other | Attending: Emergency Medicine | Admitting: Emergency Medicine

## 2016-12-06 DIAGNOSIS — E119 Type 2 diabetes mellitus without complications: Secondary | ICD-10-CM | POA: Insufficient documentation

## 2016-12-06 DIAGNOSIS — Z7984 Long term (current) use of oral hypoglycemic drugs: Secondary | ICD-10-CM | POA: Diagnosis not present

## 2016-12-06 DIAGNOSIS — R55 Syncope and collapse: Secondary | ICD-10-CM | POA: Insufficient documentation

## 2016-12-06 DIAGNOSIS — R569 Unspecified convulsions: Secondary | ICD-10-CM

## 2016-12-06 DIAGNOSIS — Z7902 Long term (current) use of antithrombotics/antiplatelets: Secondary | ICD-10-CM | POA: Diagnosis not present

## 2016-12-06 DIAGNOSIS — I10 Essential (primary) hypertension: Secondary | ICD-10-CM | POA: Diagnosis not present

## 2016-12-06 DIAGNOSIS — Z79899 Other long term (current) drug therapy: Secondary | ICD-10-CM | POA: Insufficient documentation

## 2016-12-06 LAB — DIFFERENTIAL
BASOS ABS: 0 10*3/uL (ref 0–0.1)
BASOS PCT: 1 %
Eosinophils Absolute: 0.1 10*3/uL (ref 0–0.7)
Eosinophils Relative: 2 %
Lymphocytes Relative: 31 %
Lymphs Abs: 1.7 10*3/uL (ref 1.0–3.6)
MONO ABS: 0.8 10*3/uL (ref 0.2–0.9)
Monocytes Relative: 14 %
NEUTROS ABS: 2.9 10*3/uL (ref 1.4–6.5)
NEUTROS PCT: 52 %

## 2016-12-06 LAB — URINALYSIS, COMPLETE (UACMP) WITH MICROSCOPIC
BILIRUBIN URINE: NEGATIVE
GLUCOSE, UA: NEGATIVE mg/dL
Ketones, ur: NEGATIVE mg/dL
NITRITE: NEGATIVE
PH: 6 (ref 5.0–8.0)
Protein, ur: NEGATIVE mg/dL
SPECIFIC GRAVITY, URINE: 1.013 (ref 1.005–1.030)

## 2016-12-06 LAB — COMPREHENSIVE METABOLIC PANEL
ALBUMIN: 3.7 g/dL (ref 3.5–5.0)
ALT: 12 U/L — AB (ref 14–54)
AST: 31 U/L (ref 15–41)
Alkaline Phosphatase: 137 U/L — ABNORMAL HIGH (ref 38–126)
Anion gap: 8 (ref 5–15)
BUN: 16 mg/dL (ref 6–20)
CHLORIDE: 107 mmol/L (ref 101–111)
CO2: 25 mmol/L (ref 22–32)
Calcium: 9 mg/dL (ref 8.9–10.3)
Creatinine, Ser: 1 mg/dL (ref 0.44–1.00)
GFR calc non Af Amer: 59 mL/min — ABNORMAL LOW (ref >60)
GLUCOSE: 167 mg/dL — AB (ref 65–99)
Potassium: 5.3 mmol/L — ABNORMAL HIGH (ref 3.5–5.1)
SODIUM: 140 mmol/L (ref 135–145)
Total Bilirubin: 0.9 mg/dL (ref 0.3–1.2)
Total Protein: 7.6 g/dL (ref 6.5–8.1)

## 2016-12-06 LAB — PROTIME-INR
INR: 0.95
PROTHROMBIN TIME: 12.6 s (ref 11.4–15.2)

## 2016-12-06 LAB — GLUCOSE, CAPILLARY: Glucose-Capillary: 159 mg/dL — ABNORMAL HIGH (ref 65–99)

## 2016-12-06 LAB — CBC
HCT: 37.3 % (ref 35.0–47.0)
Hemoglobin: 12.4 g/dL (ref 12.0–16.0)
MCH: 31.2 pg (ref 26.0–34.0)
MCHC: 33.2 g/dL (ref 32.0–36.0)
MCV: 93.9 fL (ref 80.0–100.0)
PLATELETS: 234 10*3/uL (ref 150–440)
RBC: 3.97 MIL/uL (ref 3.80–5.20)
RDW: 14.3 % (ref 11.5–14.5)
WBC: 5.6 10*3/uL (ref 3.6–11.0)

## 2016-12-06 LAB — APTT

## 2016-12-06 LAB — TROPONIN I

## 2016-12-06 MED ORDER — ASPIRIN 81 MG PO CHEW
324.0000 mg | CHEWABLE_TABLET | Freq: Once | ORAL | Status: AC
  Administered 2016-12-06: 324 mg via ORAL
  Filled 2016-12-06: qty 4

## 2016-12-06 MED ORDER — LACOSAMIDE 200 MG PO TABS
200.0000 mg | ORAL_TABLET | Freq: Two times a day (BID) | ORAL | Status: DC
  Administered 2016-12-06: 200 mg via ORAL
  Filled 2016-12-06: qty 1

## 2016-12-06 MED ORDER — LACOSAMIDE 50 MG PO TABS: 50.0000 mg | ORAL_TABLET | Freq: Two times a day (BID) | ORAL | 1 refills | Status: AC

## 2016-12-06 NOTE — ED Provider Notes (Signed)
-----------------------------------------   5:52 PM on 12/06/2016 -----------------------------------------  Patient's MRI is negative. Urinalysis is equivocal we will send a urine culture and hold off on treatment. Patient states her weakness is gone, she appears very well states she is ready to go home she is very hungry. As the patient appears well her symptoms have resolved I suspect her symptoms were likely due to a seizure and not CVA. As per Dr. Thad Ranger recommendations we will start the patient on Vimpat and have her follow-up with neurology. Patient agreeable to this plan.   Minna Antis, MD 12/06/16 1753

## 2016-12-06 NOTE — ED Notes (Signed)
Patient assisted to ambulate to restroom. Patient with slow but steady gait. Urine specimen obtained and sent to lab.

## 2016-12-06 NOTE — Progress Notes (Signed)
Chaplain received a code stroke page for pt in ED Rm11. Upon arrival the medical team were evaluating pt and pt.'s 2 daughters and a granddaughter were at bedside. Pt alert, coherent and responded to dc's commands. CH provided silent prayer and pastoral presence.    12/06/16 1600  Clinical Encounter Type  Visited With Patient and family together  Visit Type Initial;Spiritual support;Code;Trauma  Referral From Nurse  Consult/Referral To Chaplain  Spiritual Encounters  Spiritual Needs Prayer;Emotional;Other (Comment)

## 2016-12-06 NOTE — ED Notes (Signed)
Patient transported to MRI 

## 2016-12-06 NOTE — Consult Note (Signed)
Referring Physician: Don Perking    Chief Complaint: Slurred speech, left sided weakness/numbness  HPI: Terri Burnett is an 61 y.o. female with a history of stroke and seizures, not complaint with medications who was with family today and acutely became unresponsive.  Lost consciousness for s short period of time and afterward had slurred speech and left sided weakness.  Patient was brought in for evaluation at that time.  Initial NIHSS of 6. Patient with a history of stroke.  Per report of family patient with some mild residual left sided weakness and slurred speech.    Date last known well: Date: 12/06/2016 Time last known well: Time: 13:30 tPA Given: No: Resolution of symptoms  Past Medical History:  Diagnosis Date  . Diabetes mellitus without complication (HCC)   . Hypertension   . Stroke [facility])     History reviewed. No pertinent surgical history.  No family history on file. Social History:  reports that she has never smoked. She has never used smokeless tobacco. She reports that she does not drink alcohol or use drugs.  Allergies: No Known Allergies  Medications: I have reviewed the patient's current medications. Prior to Admission:  Prior to Admission medications   Medication Sig Start Date End Date Taking? Authorizing Provider  amLODipine (NORVASC) 10 MG tablet Take 10 mg by mouth daily.    [provider]  aspirin EC 81 MG tablet Take 81 mg by mouth daily.    [provider]  atorvastatin (LIPITOR) 40 MG tablet Take 40 mg by mouth daily.    [provider]  clopidogrel (PLAVIX) 75 MG tablet Take 75 mg by mouth daily.    [provider]  levETIRAcetam (KEPPRA) 500 MG tablet Take 1 tablet (500 mg total) by mouth 2 (two) times daily. 02/27/16   Gwynn Burly, DO  losartan (COZAAR) 100 MG tablet Take 100 mg by mouth daily. Marland Kitchenlrro   [provider]  metFORMIN (GLUCOPHAGE-XR) 500 MG 24 hr tablet Take 500 mg by mouth daily with  breakfast.    [provider]   Patient reports not taking ASA, Plavix or Keppra  ROS: History obtained from the patient  General ROS: negative for - chills, fatigue, fever, night sweats, weight gain or weight loss Psychological ROS: negative for - behavioral disorder, hallucinations, memory difficulties, mood swings or suicidal ideation Ophthalmic ROS: negative for - blurry vision, double vision, eye pain or loss of vision ENT ROS: negative for - epistaxis, nasal discharge, oral lesions, sore throat, tinnitus or vertigo Allergy and Immunology ROS: negative for - hives or itchy/watery eyes Hematological and Lymphatic ROS: negative for - bleeding problems, bruising or swollen lymph nodes Endocrine ROS: negative for - galactorrhea, hair pattern changes, polydipsia/polyuria or temperature intolerance Respiratory ROS: negative for - cough, hemoptysis, shortness of breath or wheezing Cardiovascular ROS: negative for - chest pain, dyspnea on exertion, edema or irregular heartbeat Gastrointestinal ROS: negative for - abdominal pain, diarrhea, hematemesis, nausea/vomiting or stool incontinence Genito-Urinary ROS: negative for - dysuria, hematuria, incontinence or urinary frequency/urgency Musculoskeletal ROS: negative for - joint swelling or muscular weakness Neurological ROS: as noted in HPI Dermatological ROS: negative for rash and skin lesion changes  Physical Examination: Blood pressure (!) 164/88, pulse 77, resp. rate 20, weight 97.1 kg (214 lb), SpO2 100 %.  HEENT-  Normocephalic, no lesions, without obvious abnormality.  Normal external eye and conjunctiva.  Normal TM's bilaterally.  Normal auditory canals and external ears. Normal external nose, mucus membranes and septum.  Normal  pharynx. Cardiovascular- S1, S2 normal, pulses palpable throughout   Lungs- chest clear, no wheezing, rales, normal symmetric air entry Abdomen- soft, non-tender; bowel sounds normal; no masses,  no  organomegaly Extremities- no edema Lymph-no adenopathy palpable Musculoskeletal-no joint tenderness, deformity or swelling Skin-warm and dry, no hyperpigmentation, vitiligo, or suspicious lesions  Neurological Examination   Mental Status: Alert, oriented, thought content appropriate.  Speech fluent without evidence of aphasia but slowed with some mild dysarthria noted.  Able to follow 3 step commands without difficulty. Cranial Nerves: II: Discs flat bilaterally; Visual fields grossly normal, pupils equal, round, reactive to light and accommodation III,IV, VI: ptosis not present, extra-ocular motions intact bilaterally V,VII: smile symmetric, facial light touch sensation decreased on the left VIII: hearing normal bilaterally IX,X: gag reflex present XI: bilateral shoulder shrug XII: midline tongue extension Motor: Right : Upper extremity   5/5    Left:     Upper extremity   4/5 with drift  Lower extremity   5/5     Lower extremity   3/5.  Significant give-way weakness noted Tone and bulk:normal tone throughout; no atrophy noted Sensory: Pinprick and light touch decreased on the left Deep Tendon Reflexes: 2+ and symmetric with absent AJ's bilaterally Plantars: Right: downgoing   Left: downgoing Cerebellar: Normal finger-to-nose and normal heel-to-shin testing bilaterally Gait: not tested due to safety concerns   Laboratory Studies:  Basic Metabolic Panel: No results for input(s): NA, K, CL, CO2, GLUCOSE, BUN, CREATININE, CALCIUM, MG, PHOS in the last 168 hours.  Liver Function Tests: No results for input(s): AST, ALT, ALKPHOS, BILITOT, PROT, ALBUMIN in the last 168 hours. No results for input(s): LIPASE, AMYLASE in the last 168 hours. No results for input(s): AMMONIA in the last 168 hours.  CBC:  Recent Labs Lab 12/06/16 1450  WBC 5.6  NEUTROABS 2.9  HGB 12.4  HCT 37.3  MCV 93.9  PLT 234    Cardiac Enzymes: No results for input(s): CKTOTAL, CKMB, CKMBINDEX,  TROPONINI in the last 168 hours.  BNP: Invalid input(s): POCBNP  CBG:  Recent Labs Lab 12/06/16 1426  GLUCAP 159*    Microbiology: Results for orders placed or performed during the hospital encounter of 02/25/16  Urine culture     Status: Abnormal   Collection Time: 02/25/16  8:40 PM  Result Value Ref Range Status   Specimen Description URINE, CLEAN CATCH  Final   Special Requests NONE  Final   Culture MULTIPLE SPECIES PRESENT, SUGGEST RECOLLECTION (A)  Final   Report Status 02/27/2016 FINAL  Final    Coagulation Studies: No results for input(s): LABPROT, INR in the last 72 hours.  Urinalysis: No results for input(s): COLORURINE, LABSPEC, PHURINE, GLUCOSEU, HGBUR, BILIRUBINUR, KETONESUR, PROTEINUR, UROBILINOGEN, NITRITE, LEUKOCYTESUR in the last 168 hours.  Invalid input(s): APPERANCEUR  Lipid Panel:    Component Value Date/Time   CHOL 203 (H) 12/20/2013 0435   TRIG 134 12/20/2013 0435   HDL 41 12/20/2013 0435   VLDL 27 12/20/2013 0435   LDLCALC 135 (H) 12/20/2013 0435    HgbA1C:  Lab Results  Component Value Date   HGBA1C 7.6 (H) 12/20/2013    Urine Drug Screen:  No results found for: LABOPIA, COCAINSCRNUR, LABBENZ, AMPHETMU, THCU, LABBARB  Alcohol Level: No results for input(s): ETH in the last 168 hours.  Other results: EKG: sinus rhythm at 77 bpm.  Imaging: Ct Head Code Stroke Wo Contrast  Addendum Date: 12/06/2016   ADDENDUM REPORT: 12/06/2016 15:02 ADDENDUM: Study results were called to the  Emergency Department on 12/06/2016 at 1449 hours. Electronically Signed   By: Odessa Fleming M.D.   On: 12/06/2016 15:02   Result Date: 12/06/2016 CLINICAL DATA:  Code stroke. 61 year old female with 1 hour of slurred speech and left side weakness. EXAM: CT HEAD WITHOUT CONTRAST TECHNIQUE: Contiguous axial images were obtained from the base of the skull through the vertex without intravenous contrast. COMPARISON:  Brain MRI and head CT 02/25/2016 and earlier. FINDINGS:  Brain: Chronic anterior division left MCA infarct with encephalomalacia and gliosis tracking to the left basal ganglia appears stable. Small chronic lacunar infarct in the right thalamus is stable. Heterogeneity throughout the pons was better demonstrated by MRI. Small chronic left cerebellar infarcts appear stable. Stable gray-white matter differentiation throughout the brain. No cortically based acute infarct identified. No acute intracranial hemorrhage identified. No intracranial mass effect. Stable ventricle size and configuration. Vascular: Calcified atherosclerosis at the skull base. No suspicious intracranial vascular hyperdensity. Skull: Stable.  No acute osseous abnormality identified. Sinuses/Orbits: Visualized paranasal sinuses and mastoids are stable and well pneumatized. Other: Visualized orbits and scalp soft tissues are within normal limits. ASPECTS [facility] Stroke Program Early CT Score) - Ganglionic level infarction (caudate, lentiform nuclei, internal capsule, insula, M1-M3 cortex): 7 - Supraganglionic infarction (M4-M6 cortex): 3 Total score (0-10 with 10 being normal): 10 IMPRESSION: 1. Advanced but stable appearing chronic ischemic disease since January. No acute intracranial abnormality identified. 2. ASPECTS is 10. Electronically Signed: By: Odessa Fleming M.D. On: 12/06/2016 14:45    Assessment: 61 y.o. female presenting with acute onset left sided hemiparesis and dysarthria.  Some associated loss of consciousness noted as well.  Patient improved quickly in the ED, regaining strength and sensation.  Suspect event was a seizure and not an acute infarct.  Patient not compliant with Keppra due to side effect of lethargy.  Reports being taken off antiplatelet therapy by her PCP.    Stroke Risk Factors - diabetes mellitus and hypertension  Plan: 1. MRI of the brain without contrast.  If no acute infarct noted, no further work up recommended.  If acute infarct noted will require admission but will  not be a tPA candidate due lack of significant symptomatology.   2. Prophylactic therapy-Antiplatelet med: Plavix - dose  daily 3. NPO until RN stroke swallow screen 4. Telemetry monitoring 5. Frequent neuro checks 6. Seizure precautions 7. Vimpat  IV now with maintenance of  BID   Case discussed with Dr. Cheri Rous, MD Neurology [phone] 12/06/2016, 3:09 PM   Addendum: MRI of the brain reviewed and shows no acute changes.  Patient to follow up with neurology on an outpatient basis.    Thana Farr, MD Neurology [phone]

## 2016-12-06 NOTE — ED Triage Notes (Signed)
Slurred speech and left sided weakness started at 1330 today. Slurred speech with left sided facial droop noted in triage.

## 2016-12-06 NOTE — ED Provider Notes (Signed)
[facility] Emergency Department Provider Note  ____________________________________________  Time seen: Approximately 3:45 PM  I have reviewed the triage vital signs and the nursing notes.   HISTORY  Chief Complaint Code Stroke   HPI Terri Burnett is a 61 y.o. female with a history of stroke, hypertension, diabetes, and recently diagnosed seizures who presents as a code stroke. According to the family patient was talking to them and laughing at 1:30 PM when she had an episode of syncope lasting a few seconds. No urinary or bowel loss, no postictal state. When she regained consciousness patient had worse slurred speech, and was complaining of left-sided weakness and numbness of her face, arm, and leg. Patient does have slurred speech at baseline as a deficit from prior stroke. Patient also reports that she had some leftover weakness from her prior stroke on the left side however she feels worse today. She is denying headache, chest pain, shortness of breath, fever or chills, nausea, vomiting, diarrhea, abdominal pain.  Past Medical History:  Diagnosis Date  . Diabetes mellitus without complication (HCC)   . Hypertension   . Stroke [facility]     Patient Active Problem List   Diagnosis Date Noted  . Seizures (HCC)   . Benign essential HTN   . Acute, but ill-defined, cerebrovascular disease   . Stroke-like symptoms 02/25/2016  . Altered mental status 02/25/2016  . Aphasia     History reviewed. No pertinent surgical history.  Prior to Admission medications   Medication Sig Start Date End Date Taking? Authorizing Provider  amLODipine (NORVASC) 10 MG tablet Take 10 mg by mouth daily.   Yes [provider]  atorvastatin (LIPITOR) 40 MG tablet Take 40 mg by mouth at bedtime.    Yes [provider]  clopidogrel (PLAVIX) 75 MG tablet Take 75 mg by mouth daily.   Yes [provider]  losartan (COZAAR) 100 MG tablet Take 100 mg  by mouth daily.   Yes [provider]  metFORMIN (GLUCOPHAGE-XR) 500 MG 24 hr tablet Take 500 mg by mouth daily with breakfast.   Yes [provider]  lacosamide (VIMPAT) 50 MG TABS tablet Take 1 tablet (50 mg total) by mouth 2 (two) times daily. 12/06/16   Minna Antis, MD    Allergies Patient has no known allergies.  No family history on file.  Social History Social History  Substance Use Topics  . Smoking status: Never Smoker  . Smokeless tobacco: Never Used  . Alcohol use No    Review of Systems  Constitutional: Negative for fever. Eyes: Negative for visual changes. ENT: Negative for sore throat. Neck: No neck pain  Cardiovascular: Negative for chest pain. Respiratory: Negative for shortness of breath. Gastrointestinal: Negative for abdominal pain, vomiting or diarrhea. Genitourinary: Negative for dysuria. Musculoskeletal: Negative for back pain. Skin: Negative for rash. Neurological: Negative for headaches. + Slurred speech, left-sided weakness and numbness Psych: No SI or HI  ____________________________________________   PHYSICAL EXAM:  VITAL SIGNS: ED Triage Vitals  Enc Vitals Group     BP 12/06/16 1421 (!) 164/88     Pulse Rate 12/06/16 1421 77     Resp 12/06/16 1421 20     Temp --      Temp Source 12/06/16 1421 Oral     SpO2 12/06/16 1421 100 %     Weight 12/06/16 1422 214 lb (97.1 kg)     Height --      Head Circumference --  Peak Flow --      Pain Score --      Pain Loc --      Pain Edu? --      Excl. in GC? --     Constitutional: Alert and oriented. Well appearing and in no apparent distress. HEENT:      Head: Normocephalic and atraumatic.         Eyes: Conjunctivae are normal. Sclera is non-icteric.       Mouth/Throat: Mucous membranes are moist.       Neck: Supple with no signs of meningismus. Cardiovascular: Regular rate and rhythm. No murmurs, gallops, or rubs. 2+ symmetrical distal pulses are present in all  extremities. No JVD. Respiratory: Normal respiratory effort. Lungs are clear to auscultation bilaterally. No wheezes, crackles, or rhonchi.  Gastrointestinal: Soft, non tender, and non distended with positive bowel sounds. No rebound or guarding. Musculoskeletal: Nontender with normal range of motion in all extremities. No edema, cyanosis, or erythema of extremities. Neurologic: slurred speech, face is symmetric, patient does endorse decreased sensation to touch on the left but no extinction, intact strength on bilateral upper extremities, 4 out of 5 strength on left lower extremity, no pronator drift or dysmetria on exam Skin: Skin is warm, dry and intact. No rash noted. Psychiatric: Mood and affect are normal. Speech and behavior are normal.  ____________________________________________   LABS (all labs ordered are listed, but only abnormal results are displayed)  Labs Reviewed  COMPREHENSIVE METABOLIC PANEL - Abnormal; Notable for the following:       Result Value   Potassium 5.3 (*)    Glucose, Bld 167 (*)    ALT 12 (*)    Alkaline Phosphatase 137 (*)    GFR calc non Af Amer 59 (*)    All other components within normal limits  GLUCOSE, CAPILLARY - Abnormal; Notable for the following:    Glucose-Capillary 159 (*)    All other components within normal limits  APTT - Abnormal; Notable for the following:    aPTT <24 (*)    All other components within normal limits  URINALYSIS, COMPLETE (UACMP) WITH MICROSCOPIC - Abnormal; Notable for the following:    Color, Urine YELLOW (*)    APPearance HAZY (*)    Hgb urine dipstick SMALL (*)    Leukocytes, UA LARGE (*)    Bacteria, UA RARE (*)    Squamous Epithelial / LPF 6-30 (*)    All other components within normal limits  URINE CULTURE  CBC  DIFFERENTIAL  TROPONIN I  PROTIME-INR  CBG MONITORING, ED   ____________________________________________  EKG  ED ECG REPORT I, Nita Sickle, the attending physician, personally viewed  and interpreted this ECG.  Normal sinus rhythm, rate of 77, normal intervals, normal axis, no ST elevations or depressions.  ____________________________________________  RADIOLOGY  Head CT:  1. Advanced but stable appearing chronic ischemic disease since January. No acute intracranial abnormality identified. 2. ASPECTS is 10. ____________________________________________   PROCEDURES  Procedure(s) performed: None Procedures Critical Care performed:  None ____________________________________________   INITIAL IMPRESSION / ASSESSMENT AND PLAN / ED COURSE   61 y.o. female with a history of stroke, hypertension, diabetes, and recently diagnosed seizures who presents as a code stroke. last seen normal at 1:30 PM. The patient evaluated by myself and neurologist upon arrival to the emergency room. Dr. Thad Ranger concerned that symptoms due to possible seizure instead of stroke due to inconsistent examination and wax and waning symptoms. Recommended loading patient with Vimpat and  brain MRI. If MRI negative and patient back to baseline plan to dc home per Neurology recs. Patient given ASA. Labs pending. Care transferred to Dr. Lenard Lance      As part of my medical decision making, I reviewed the following data within the electronic MEDICAL RECORD NUMBER History obtained from family, Nursing notes reviewed and incorporated, Labs reviewed , EKG interpreted , Old EKG reviewed, Old chart reviewed, Patient signed out to Dr. Lenard Lance, A consult was requested and obtained from this/these consultant(s) Neurology, Notes from prior ED visits and [place] Controlled Substance Database    Pertinent labs & imaging results that were available during my care of the patient were reviewed by me and considered in my medical decision making (see chart for details).    ____________________________________________   FINAL CLINICAL IMPRESSION(S) / ED DIAGNOSES  Final diagnoses:  Seizure (HCC)      NEW  MEDICATIONS STARTED DURING THIS VISIT:  Discharge Medication List as of 12/06/2016  5:55 PM    START taking these medications   Details  lacosamide (VIMPAT) 50 MG TABS tablet Take 1 tablet (50 mg total) by mouth 2 (two) times daily., Starting Tue 12/06/2016, Print         Note:  This document was prepared using Dragon voice recognition software and may include unintentional dictation errors.    Nita Sickle, MD 12/07/16 [phone]

## 2016-12-08 LAB — URINE CULTURE

## 2017-03-15 ENCOUNTER — Other Ambulatory Visit: Payer: Self-pay | Admitting: Family Medicine

## 2017-03-15 DIAGNOSIS — Z1231 Encounter for screening mammogram for malignant neoplasm of breast: Secondary | ICD-10-CM

## 2017-09-01 ENCOUNTER — Telehealth: Payer: Self-pay | Admitting: *Deleted

## 2017-09-01 DIAGNOSIS — Z87891 Personal history of nicotine dependence: Secondary | ICD-10-CM

## 2017-09-01 DIAGNOSIS — Z122 Encounter for screening for malignant neoplasm of respiratory organs: Secondary | ICD-10-CM

## 2017-09-01 NOTE — Telephone Encounter (Signed)
Received referral for initial lung cancer screening scan. Contacted patient and obtained smoking history,(former, quit 2009, 34 pack year) as well as answering questions related to screening process. Patient denies signs of lung cancer such as weight loss or hemoptysis. Patient denies comorbidity that would prevent curative treatment if lung cancer were found. Patient is scheduled for shared decision making visit and CT scan on 09/13/17.

## 2017-09-12 ENCOUNTER — Encounter: Payer: Self-pay | Admitting: Oncology

## 2017-09-13 ENCOUNTER — Ambulatory Visit
Admission: RE | Admit: 2017-09-13 | Discharge: 2017-09-13 | Disposition: A | Discharge status: Home / Self Care | Payer: Medicare Other | Source: Ambulatory Visit | Attending: Oncology | Admitting: Oncology

## 2017-09-13 ENCOUNTER — Inpatient Hospital Stay: Payer: Medicare Other | Attending: Oncology | Admitting: Oncology

## 2017-09-13 DIAGNOSIS — Z87891 Personal history of nicotine dependence: Secondary | ICD-10-CM | POA: Diagnosis not present

## 2017-09-13 DIAGNOSIS — J432 Centrilobular emphysema: Secondary | ICD-10-CM | POA: Insufficient documentation

## 2017-09-13 DIAGNOSIS — K8020 Calculus of gallbladder without cholecystitis without obstruction: Secondary | ICD-10-CM | POA: Insufficient documentation

## 2017-09-13 DIAGNOSIS — I2510 Atherosclerotic heart disease of native coronary artery without angina pectoris: Secondary | ICD-10-CM | POA: Insufficient documentation

## 2017-09-13 DIAGNOSIS — Z122 Encounter for screening for malignant neoplasm of respiratory organs: Secondary | ICD-10-CM

## 2017-09-13 NOTE — Progress Notes (Signed)
In accordance with CMS guidelines, patient has met eligibility criteria including age, absence of signs or symptoms of lung cancer.  Social History   Tobacco Use  . Smoking status: Former Smoker    Packs/day: 1.00    Years: 34.00    Pack years: 34.00    Types: Cigarettes    Last attempt to quit: 2009    Years since quitting: 10.5  . Smokeless tobacco: Never Used  Substance Use Topics  . Alcohol use: No  . Drug use: No     A shared decision-making session was conducted prior to the performance of CT scan. This includes one or more decision aids, includes benefits and harms of screening, follow-up diagnostic testing, over-diagnosis, false positive rate, and total radiation exposure.  Counseling on the importance of adherence to annual lung cancer LDCT screening, impact of co-morbidities, and ability or willingness to undergo diagnosis and treatment is imperative for compliance of the program.  Counseling on the importance of continued smoking cessation for former smokers; the importance of smoking cessation for current smokers, and information about tobacco cessation interventions have been given to patient including [place] and [employer] quit [place] programs.  Written order for lung cancer screening with LDCT has been given to the patient and any and all questions have been answered to the best of my abilities.   Yearly follow up will be coordinated by Burgess Estelle, Thoracic Navigator.  Faythe Casa, NP 09/13/2017 11:03 AM

## 2017-09-14 ENCOUNTER — Encounter: Payer: Self-pay | Admitting: *Deleted

## 2017-11-28 ENCOUNTER — Other Ambulatory Visit: Payer: Self-pay

## 2017-11-28 ENCOUNTER — Emergency Department
Admission: EM | Admit: 2017-11-28 | Discharge: 2017-11-28 | Disposition: A | Discharge status: Home / Self Care | Payer: Medicare Other | Attending: Emergency Medicine | Admitting: Emergency Medicine

## 2017-11-28 ENCOUNTER — Encounter: Payer: Self-pay | Admitting: Emergency Medicine

## 2017-11-28 DIAGNOSIS — Z760 Encounter for issue of repeat prescription: Secondary | ICD-10-CM | POA: Insufficient documentation

## 2017-11-28 DIAGNOSIS — I10 Essential (primary) hypertension: Secondary | ICD-10-CM | POA: Insufficient documentation

## 2017-11-28 DIAGNOSIS — Z87891 Personal history of nicotine dependence: Secondary | ICD-10-CM | POA: Insufficient documentation

## 2017-11-28 DIAGNOSIS — Z79899 Other long term (current) drug therapy: Secondary | ICD-10-CM | POA: Diagnosis not present

## 2017-11-28 DIAGNOSIS — Z7901 Long term (current) use of anticoagulants: Secondary | ICD-10-CM | POA: Diagnosis not present

## 2017-11-28 DIAGNOSIS — E119 Type 2 diabetes mellitus without complications: Secondary | ICD-10-CM | POA: Insufficient documentation

## 2017-11-28 DIAGNOSIS — Z7984 Long term (current) use of oral hypoglycemic drugs: Secondary | ICD-10-CM | POA: Diagnosis not present

## 2017-11-28 DIAGNOSIS — R569 Unspecified convulsions: Secondary | ICD-10-CM | POA: Insufficient documentation

## 2017-11-28 MED ORDER — LACOSAMIDE 50 MG PO TABS
50.0000 mg | ORAL_TABLET | Freq: Two times a day (BID) | ORAL | Status: DC
  Administered 2017-11-28: 50 mg via ORAL
  Filled 2017-11-28: qty 1

## 2017-11-28 NOTE — ED Triage Notes (Signed)
Pt to triage via w/c with no distress noted; family member reports brief period of repetitive motion with head "like going into a seizure" while eating dinner; pt denies any c/o; st out of her seizure medication x 2 days, has rx to pick up in morning (vimpat 50mg ) but wants to get a dose here tonight

## 2017-11-28 NOTE — Discharge Instructions (Addendum)
Do not drink, drive, climb ladders, soaking the tub, or do anything else which, if interrupted by seizure, could cause you harm.  Be sure to get your prescription filled tomorrow, return to the emergency room for any new or worrisome symptoms including persistent seizures, several seizures in a row without coming back to baseline or a seizure that lasts longer than 5 minutes.

## 2017-11-28 NOTE — ED Provider Notes (Signed)
[facility] Emergency Department Provider Note  ____________________________________________   I have reviewed the triage vital signs and the nursing notes. Where available I have reviewed prior notes and, if possible and indicated, outside hospital notes.    HISTORY  Chief Complaint Medication Refill    HPI CAYLEE VLACHOS is a 62 y.o. female who presents today complaining of "I feel all right".  Patient is on Vimpat for seizures, ran out 2 days ago.  They are able to get the prescription tomorrow but they want to make sure she does not have a seizure between now and then.  She seemed slightly shaky before dinner, they are worried that she might have a seizure although she did not.  She is at her baseline, does have some slow speech from prior CVAs but no other concerns and she has not had any recent seizures.      Past Medical History:  Diagnosis Date  . Diabetes mellitus without complication (HCC)   . Hypertension   . Stroke [facility])     Patient Active Problem List   Diagnosis Date Noted  . Seizures (HCC)   . Benign essential HTN   . Acute, but ill-defined, cerebrovascular disease   . Stroke-like symptoms 02/25/2016  . Altered mental status 02/25/2016  . Aphasia     History reviewed. No pertinent surgical history.  Prior to Admission medications   Medication Sig Start Date End Date Taking? Authorizing Provider  amLODipine (NORVASC) 10 MG tablet Take 10 mg by mouth daily.    [provider]  atorvastatin (LIPITOR) 40 MG tablet Take 40 mg by mouth at bedtime.     [provider]  clopidogrel (PLAVIX) 75 MG tablet Take 75 mg by mouth daily.    [provider]  lacosamide (VIMPAT) 50 MG TABS tablet Take 1 tablet (50 mg total) by mouth 2 (two) times daily. 12/06/16   Minna Antis, MD  losartan (COZAAR) 100 MG tablet Take 100 mg by mouth daily.    [provider]  metFORMIN (GLUCOPHAGE-XR) 500 MG 24 hr  tablet Take 500 mg by mouth daily with breakfast.    [provider]    Allergies Patient has no known allergies.  No family history on file.  Social History Social History   Tobacco Use  . Smoking status: Former Smoker    Packs/day: 1.00    Years: 34.00    Pack years: 34.00    Types: Cigarettes    Last attempt to quit: 2009    Years since quitting: 10.7  . Smokeless tobacco: Never Used  Substance Use Topics  . Alcohol use: No  . Drug use: No    Review of Systems Constitutional: No fever/chills Eyes: No visual changes. ENT: No sore throat. No stiff neck no neck pain Cardiovascular: Denies chest pain. Respiratory: Denies shortness of breath. Gastrointestinal:   no vomiting.  No diarrhea.  No constipation. Genitourinary: Negative for dysuria. Musculoskeletal: Negative lower extremity swelling Skin: Negative for rash. Neurological: Negative for severe headaches, focal weakness or numbness.   ____________________________________________   PHYSICAL EXAM:  VITAL SIGNS: ED Triage Vitals  Enc Vitals Group     BP 11/28/17 1941 136/83     Pulse Rate 11/28/17 1941 80     Resp 11/28/17 1941 20     Temp 11/28/17 1941 98.8 F (37.1 C)     Temp Source 11/28/17 1941 Oral     SpO2 11/28/17 1941 97 %     Weight 11/28/17  1937 211 lb (95.7 kg)     Height 11/28/17 1937 5\' 7"  (1.702 m)     Head Circumference --      Peak Flow --      Pain Score 11/28/17 1937 0     Pain Loc --      Pain Edu? --      Excl. in GC? --     Constitutional: Alert and oriented. Well appearing and in no acute distress. Eyes: Conjunctivae are normal Head: Atraumatic HEENT: No congestion/rhinnorhea. Mucous membranes are moist.  Oropharynx non-erythematous Neck:   Nontender with no meningismus, no masses, no stridor Cardiovascular: Normal rate, regular rhythm. Grossly normal heart sounds.  Good peripheral circulation. Respiratory: Normal respiratory effort.  No retractions. Lungs  CTAB. Abdominal: Soft and nontender. No distention. No guarding no rebound Back:  There is no focal tenderness or step off.  there is no midline tenderness there are no lesions noted. there is no CVA tenderness Musculoskeletal: No lower extremity tenderness, no upper extremity tenderness. No joint effusions, no DVT signs strong distal pulses no edema Neurologic: Delayed speech otherwise normal language. No gross focal neurologic deficits are appreciated.  Skin:  Skin is warm, dry and intact. No rash noted. Psychiatric: Mood and affect are normal. Speech and behavior are normal.  ____________________________________________   LABS (all labs ordered are listed, but only abnormal results are displayed)  Labs Reviewed - No data to display  Pertinent labs  results that were available during my care of the patient were reviewed by me and considered in my medical decision making (see chart for details). ____________________________________________  EKG  I personally interpreted any EKGs ordered by me or triage  ____________________________________________  RADIOLOGY  Pertinent labs & imaging results that were available during my care of the patient were reviewed by me and considered in my medical decision making (see chart for details). If possible, patient and/or family made aware of any abnormal findings.  No results found. ____________________________________________    PROCEDURES  Procedure(s) performed: None  Procedures  Critical Care performed: None  ____________________________________________   INITIAL IMPRESSION / ASSESSMENT AND PLAN / ED COURSE  Pertinent labs & imaging results that were available during my care of the patient were reviewed by me and considered in my medical decision making (see chart for details).  Patient here because she is out of her seizure medications and family are proactively trying to ensure that she does not have a seizure.  We have  ordered Vimpat from the pharmacy at her recommended dose.  We will give that to her.  She has not had a seizure.  Seizure precautions and follow-up given and understood and they state they can get the medications as an outpatient    ____________________________________________   FINAL CLINICAL IMPRESSION(S) / ED DIAGNOSES  Final diagnoses:  None      This chart was dictated using voice recognition software.  Despite best efforts to proofread,  errors can occur which can change meaning.      Jeanmarie Plant, MD 11/28/17 2021

## 2018-09-10 ENCOUNTER — Telehealth: Payer: Self-pay | Admitting: *Deleted

## 2018-09-10 DIAGNOSIS — Z122 Encounter for screening for malignant neoplasm of respiratory organs: Secondary | ICD-10-CM

## 2018-09-10 DIAGNOSIS — Z87891 Personal history of nicotine dependence: Secondary | ICD-10-CM

## 2018-09-10 NOTE — Telephone Encounter (Signed)
Patient has been notified that annual lung cancer screening low dose CT scan is due currently or will be in near future. Confirmed that patient is within the age range of 55-77, and asymptomatic, (no signs or symptoms of lung cancer). Patient denies illness that would prevent curative treatment for lung cancer if found. Verified smoking history, (former, quit 2009, 34 pack year). The shared decision making visit was done 09/13/17. Patient is agreeable for CT scan being scheduled.

## 2018-09-27 ENCOUNTER — Ambulatory Visit: Admission: RE | Admit: 2018-09-27 | Payer: Medicare Other | Source: Ambulatory Visit

## 2018-11-27 ENCOUNTER — Telehealth: Payer: Self-pay

## 2018-11-27 DIAGNOSIS — Z87891 Personal history of nicotine dependence: Secondary | ICD-10-CM

## 2018-11-27 DIAGNOSIS — Z122 Encounter for screening for malignant neoplasm of respiratory organs: Secondary | ICD-10-CM

## 2018-11-27 NOTE — Telephone Encounter (Signed)
Spoke with pt's daughter to inform her it is time for pt's annual lung cancer screening. Confirmed pt's smoking history (former smoker, 1 ppd, quit 10 years ago, 34 pack year history). Per daughter, pt has not had any major health changes in the last year and is agreeable to CT scan being scheduled. Pt's daughter states Tuesday would be the only day next week she could have an appt on (10/13), or Wednesday the week after (10/21).

## 2018-11-28 NOTE — Addendum Note (Signed)
Addended by: Lieutenant Diego on: 11/28/2018 03:22 PM   Modules accepted: Orders

## 2018-11-28 NOTE — Telephone Encounter (Signed)
Smoking history former, quit 2009, 34 pack year

## 2018-12-04 ENCOUNTER — Ambulatory Visit: Admission: RE | Admit: 2018-12-04 | Payer: Medicare Other | Source: Ambulatory Visit

## 2018-12-06 ENCOUNTER — Telehealth: Payer: Self-pay | Admitting: *Deleted

## 2018-12-06 NOTE — Telephone Encounter (Signed)
Left message for patient to notify them that it is time to schedule annual low dose lung cancer screening CT scan. Instructed patient to call back to verify information prior to the scan being scheduled.  

## 2019-05-30 ENCOUNTER — Encounter: Payer: Self-pay | Admitting: *Deleted

## 2019-12-05 IMAGING — CT CT CHEST LUNG CANCER SCREENING LOW DOSE W/O CM
2 of 5 series · 15 of 40 positions shown, 18 images · non-contrast
Comparison: None.

CLINICAL DATA: Former smoker, quit 5 years ago, 34 pack-year
history.

EXAM:
CT CHEST WITHOUT CONTRAST LOW-DOSE FOR LUNG CANCER SCREENING
TECHNIQUE: Multidetector CT imaging of the chest was performed following the
standard protocol without IV contrast.

[Series 3: lung · axial · 0.69mm/px · z∈[-1202,-915]mm · 12 of 317 slices shown, 15 images (1 of 2)]
[im 15/317  mediastinal]
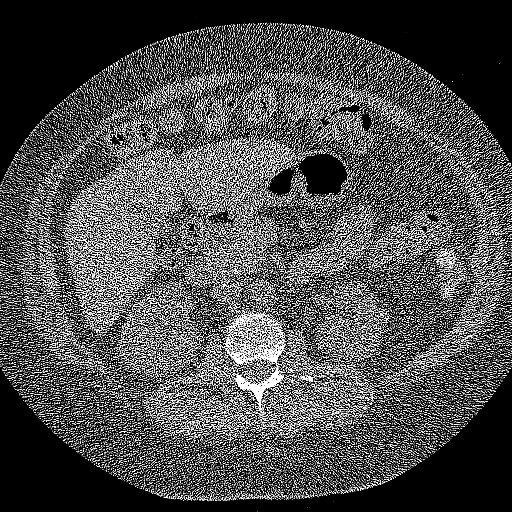
[im 15/317  lung]
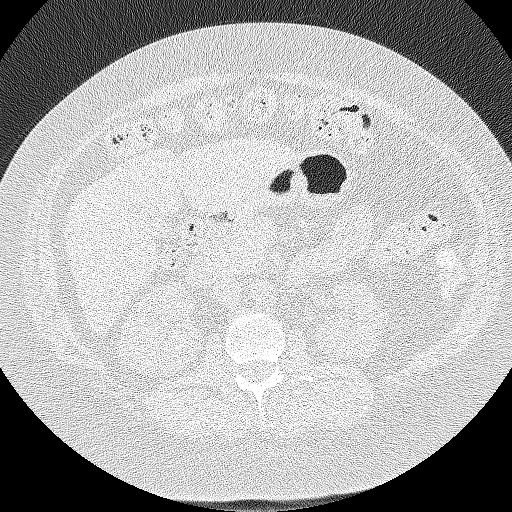
[im 44/317  lung]
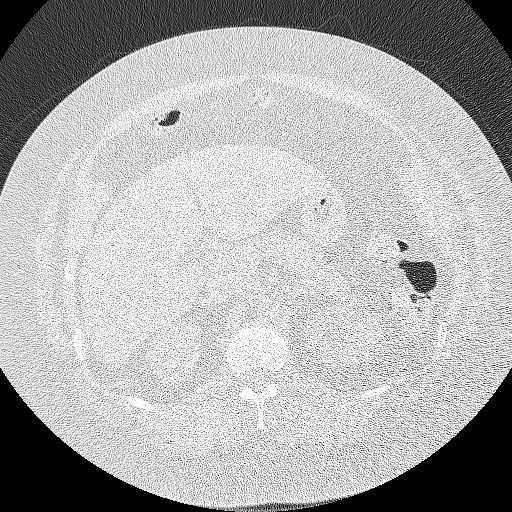
[im 72/317  lung]
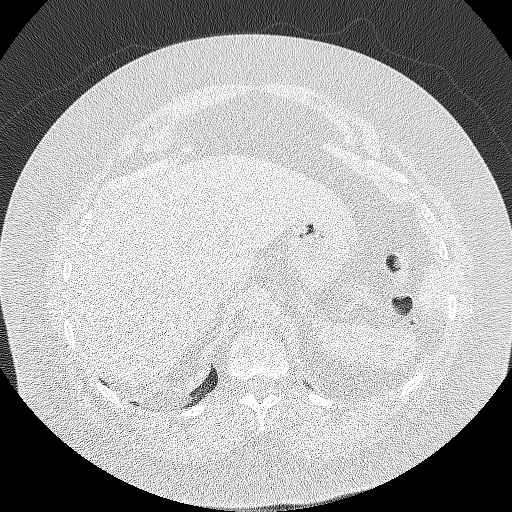
[im 101/317  lung]
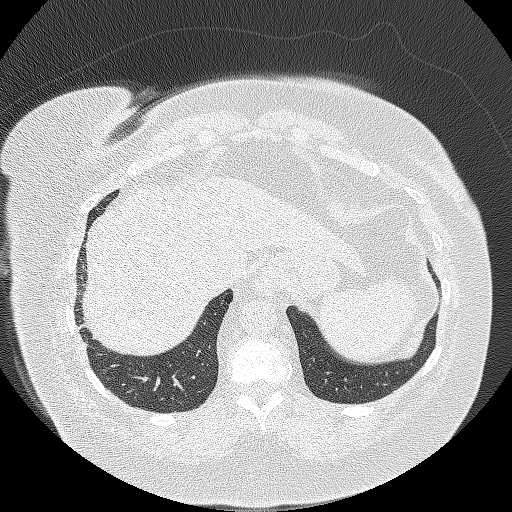
[im 115/317  mediastinal]
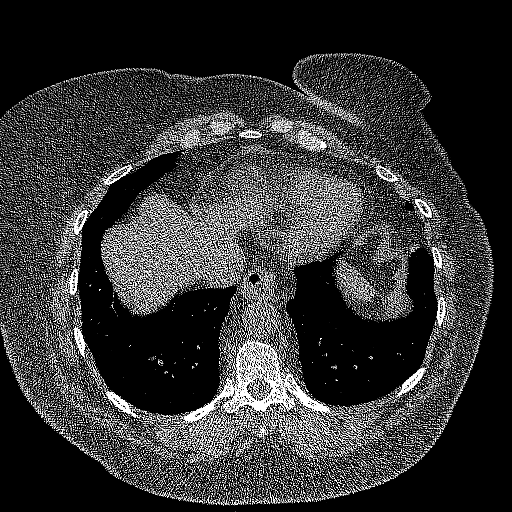
[im 115/317  lung]
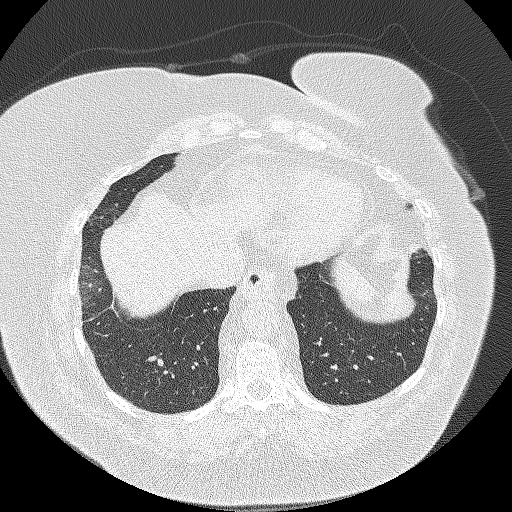
[im 144/317  lung]
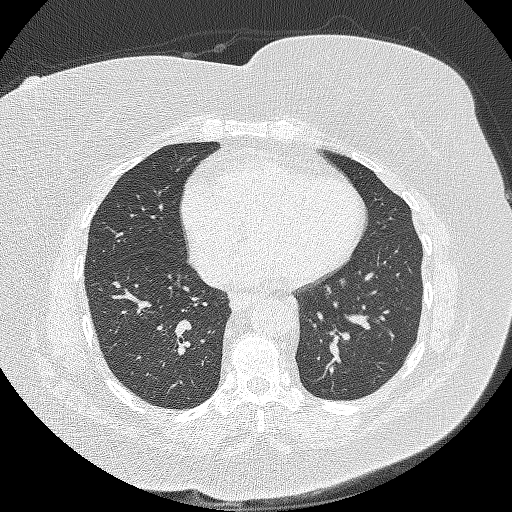
[im 173/317  lung]
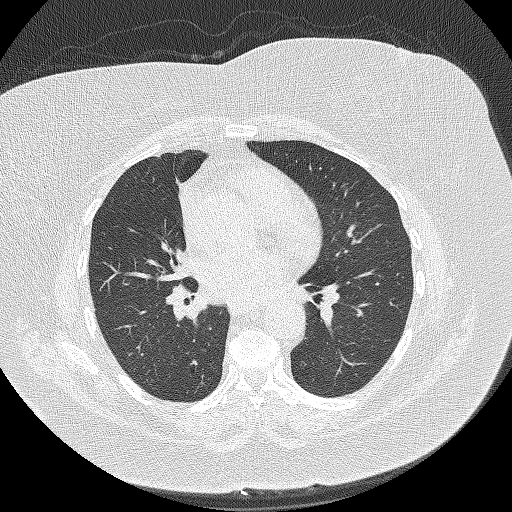
[im 202/317  lung]
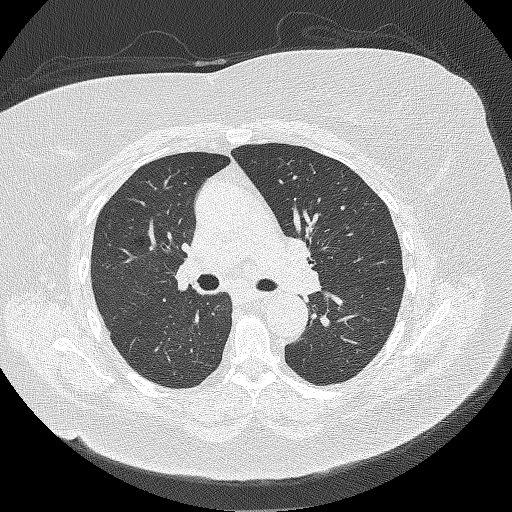
[im 216/317  mediastinal]
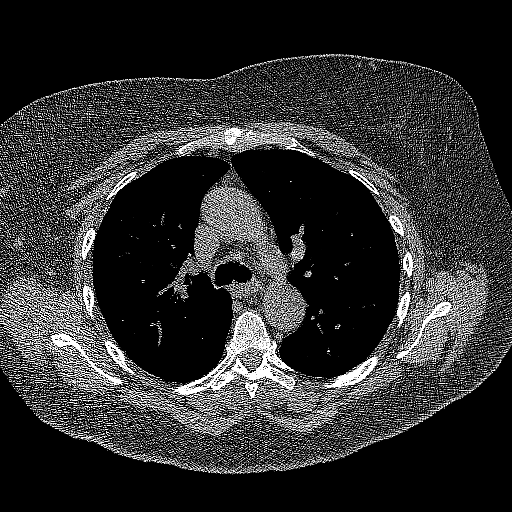
[im 216/317  lung]
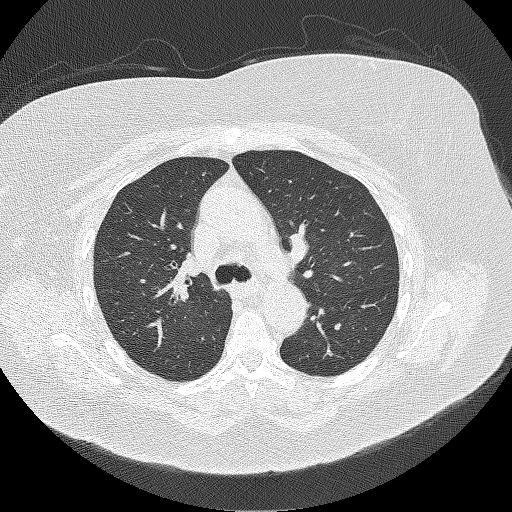
[im 245/317  lung]
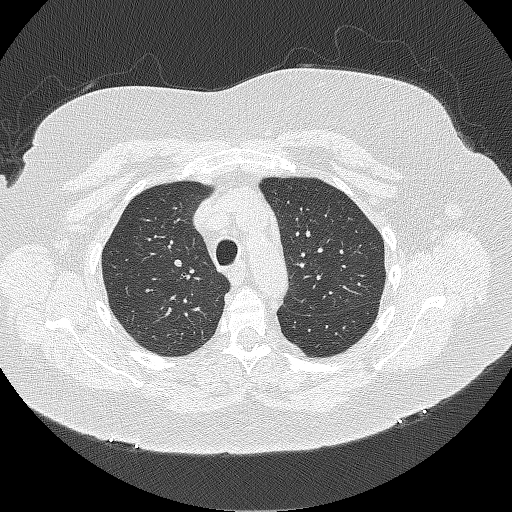
[im 273/317  lung]
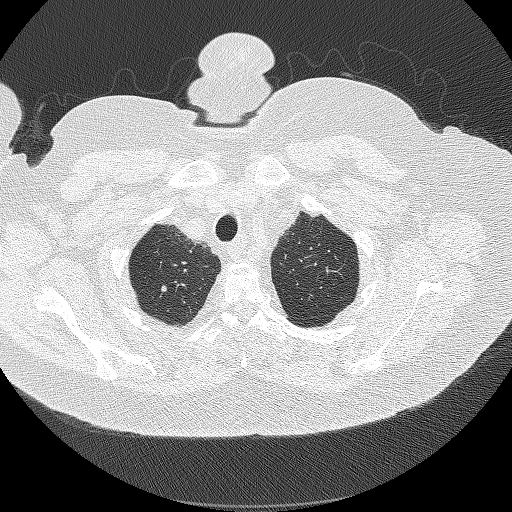
[im 302/317  lung]
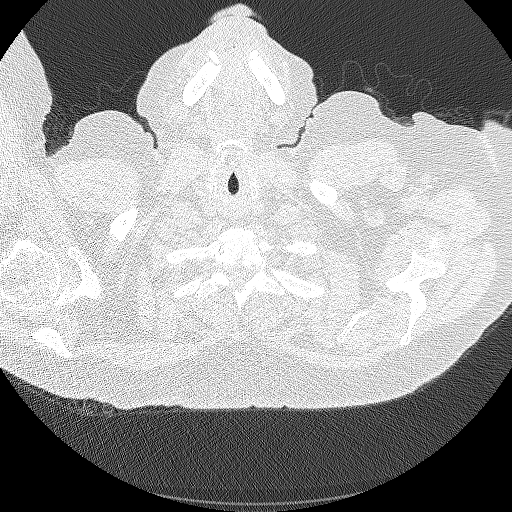

[Series 4: lung · coronal · 0.57mm/px · 3 of 338 slices shown (2 of 2)]
[im 68/338  lung]
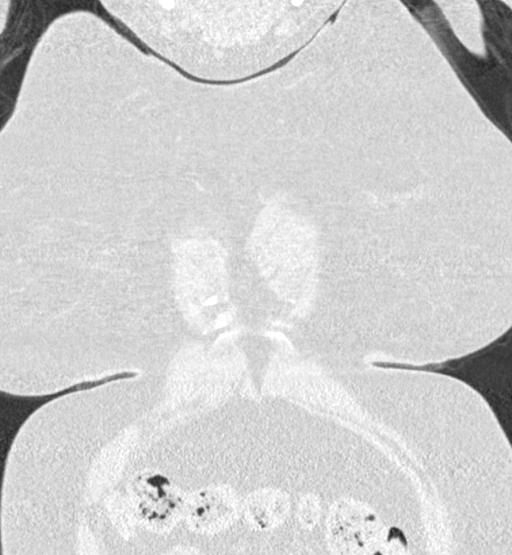
[im 135/338  lung]
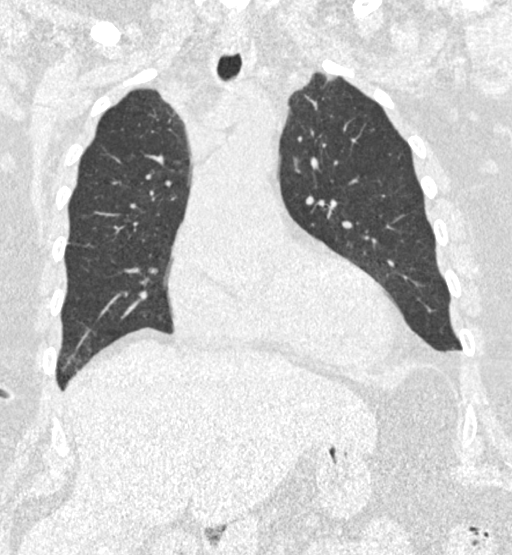
[im 203/338  lung]
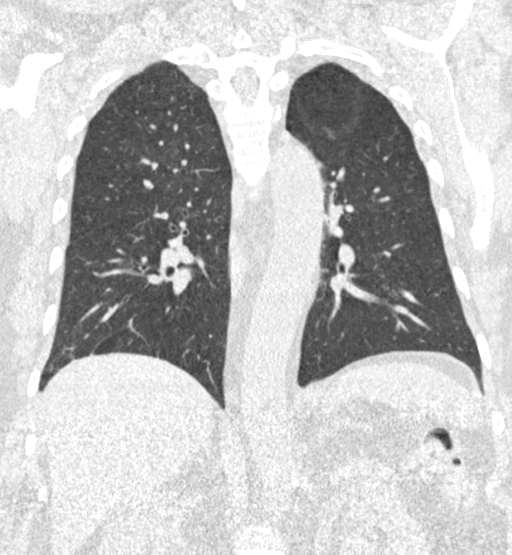

[15 of 40 positions shown; findings below may reference images not displayed]

FINDINGS: Cardiovascular: Coronary artery and aortic valvular calcification.
Heart size normal. No pericardial effusion.

Mediastinum/Nodes: No pathologically enlarged mediastinal or
axillary lymph nodes. Hilar regions are difficult to evaluate
without IV contrast but appear grossly unremarkable. Esophagus is
grossly unremarkable. Tiny hiatal hernia.

Lungs/Pleura: Mild centrilobular emphysema. Pulmonary nodules
measure 5.8 mm or less in size. No pleural fluid. Airway is
unremarkable.

Upper Abdomen: Visualized portion of the liver is unremarkable.
Gallstone is partially imaged. Visualized portions of the adrenal
glands and right kidney are unremarkable. 11 mm low-attenuation
lesion off the interpolar left kidney is too small to characterize.
Visualized portions of the spleen, pancreas, stomach and bowel are
grossly unremarkable the exception of a tiny hiatal hernia. No upper
abdominal adenopathy.

Musculoskeletal: Degenerative changes in the spine. No worrisome
lytic or sclerotic lesions.
IMPRESSION: 1. Lung-RADS 2, benign appearance or behavior. Continue annual
screening with low-dose chest CT without contrast in 12 months.
2. Coronary artery calcification.
3.  Emphysema (C4BBA-CP5.6).
4. Cholelithiasis.

## 2021-02-09 ENCOUNTER — Other Ambulatory Visit: Payer: Self-pay

## 2021-02-09 ENCOUNTER — Ambulatory Visit (INDEPENDENT_AMBULATORY_CARE_PROVIDER_SITE_OTHER): Payer: Medicare Other | Admitting: Vascular Surgery

## 2021-02-09 VITALS — BP 158/89 | HR 76 | Ht 68.0 in | Wt 214.0 lb

## 2021-02-09 DIAGNOSIS — R531 Weakness: Secondary | ICD-10-CM | POA: Diagnosis not present

## 2021-02-09 DIAGNOSIS — E669 Obesity, unspecified: Secondary | ICD-10-CM | POA: Insufficient documentation

## 2021-02-09 DIAGNOSIS — E1151 Type 2 diabetes mellitus with diabetic peripheral angiopathy without gangrene: Secondary | ICD-10-CM

## 2021-02-09 DIAGNOSIS — I10 Essential (primary) hypertension: Secondary | ICD-10-CM

## 2021-02-09 DIAGNOSIS — I70221 Atherosclerosis of native arteries of extremities with rest pain, right leg: Secondary | ICD-10-CM | POA: Diagnosis not present

## 2021-02-09 DIAGNOSIS — I70229 Atherosclerosis of native arteries of extremities with rest pain, unspecified extremity: Secondary | ICD-10-CM | POA: Insufficient documentation

## 2021-02-09 DIAGNOSIS — E119 Type 2 diabetes mellitus without complications: Secondary | ICD-10-CM | POA: Insufficient documentation

## 2021-02-09 NOTE — Assessment & Plan Note (Signed)
The patient describes symptoms concerning for atherosclerosis with rest pain of the right foot.  Although I do not have the studies, her family reports that her primary care physician noticed very poor circulation in the right leg as well on noninvasive studies.  She may also have some degree of neuropathic pain after her stroke, but seems to be the atherosclerosis of the primary cause.  I discussed with them that this is a critical and limb threatening situation.  I discussed the pathophysiology and natural history of peripheral arterial disease.  Given her advanced disease state, I would recommend proceeding with angiogram with possible revascularization.  I also discussed some lesions are more amenable to open surgical therapy than they are percutaneous revascularization, but if percutaneous revascularization is an option we will try to do that concomitant to her initial angiogram.  Patient and her daughter voiced their understanding and are agreeable to proceed.  She is on appropriate medical therapy with Plavix and Lipitor.

## 2021-02-09 NOTE — Assessment & Plan Note (Signed)
blood pressure control important in reducing the progression of atherosclerotic disease. On appropriate oral medications.  

## 2021-02-09 NOTE — Progress Notes (Signed)
Patient ID: Terri Burnett, female   DOB: 1955-12-04, 65 y.o.   MRN: [id number]  Chief Complaint  Patient presents with   New Patient (Initial Visit)    NP consult PVD screening  done  found and found to be significant . Referred  by Ranae Plumber     HPI Terri Burnett is a 65 y.o. female.  I am asked to see the patient by Ranae Plumber for evaluation of peripheral arterial disease.  The patient has been having problems with her right leg now for many months and maybe more than a year.  She was having pain with walking at least a year ago.  This was mostly in the calf and lower leg with activity.  She is now to the point where she has pain in her foot if she lays flat on the bed.  No open wounds or infection.  No other symptoms with the left leg.  Although I do not have the studies for my review, her family reports that she had noninvasive studies done by her primary care physician which showed very poor circulation on the right.  Given that finding, she is referred for further evaluation and treatment.     Past Medical History:  Diagnosis Date   Diabetes mellitus without complication ([place])    Hypertension    Stroke ([place])     No past surgical history on file.  Family History No reported history of bleeding disorders, clotting disorders, aneurysms, or porphyria although patient really cannot provide history herself.   Social History   Tobacco Use   Smoking status: Former    Packs/day: 1.00    Years: 34.00    Pack years: 34.00    Types: Cigarettes    Quit date: 2009    Years since quitting: 13.9   Smokeless tobacco: Never  Substance Use Topics   Alcohol use: No   Drug use: No    No Known Allergies  Current Outpatient Medications  Medication Sig Dispense Refill   amLODipine (NORVASC) 10 MG tablet Take 10 mg by mouth daily.     atorvastatin (LIPITOR) 40 MG tablet Take 40 mg by mouth at bedtime.      clopidogrel (PLAVIX) 75 MG tablet Take 75 mg by  mouth daily.     lacosamide (VIMPAT) 50 MG TABS tablet Take 1 tablet (50 mg total) by mouth 2 (two) times daily. 60 tablet 1   losartan (COZAAR) 100 MG tablet Take 100 mg by mouth daily.     metFORMIN (GLUCOPHAGE-XR) 500 MG 24 hr tablet Take 500 mg by mouth daily with breakfast.     No current facility-administered medications for this visit.      REVIEW OF SYSTEMS (Negative unless checked)  Constitutional: [] Weight loss  [] Fever  [] Chills Cardiac: [] Chest pain   [] Chest pressure   [] Palpitations   [] Shortness of breath when laying flat   [] Shortness of breath at rest   [x] Shortness of breath with exertion. Vascular:  [x] Pain in legs with walking   [] Pain in legs at rest   [] Pain in legs when laying flat   [x] Claudication   [] Pain in feet when walking  [x] Pain in feet at rest  [] Pain in feet when laying flat   [] History of DVT   [] Phlebitis   [] Swelling in legs   [] Varicose veins   [] Non-healing ulcers Pulmonary:   [] Uses home oxygen   [] Productive cough   [] Hemoptysis   [] Wheeze  [] COPD   [] Asthma Neurologic:  []   Dizziness  [] Blackouts   [] Seizures   [] History of stroke   [] History of TIA  [] Aphasia   [] Temporary blindness   [] Dysphagia   [] Weakness or numbness in arms   [] Weakness or numbness in legs Musculoskeletal:  [x] Arthritis   [] Joint swelling   [x] Joint pain   [] Low back pain Hematologic:  [] Easy bruising  [] Easy bleeding   [] Hypercoagulable state   [] Anemic  [] Hepatitis Gastrointestinal:  [] Blood in stool   [] Vomiting blood  [x] Gastroesophageal reflux/heartburn   [] Abdominal pain Genitourinary:  [] Chronic kidney disease   [] Difficult urination  [] Frequent urination  [] Burning with urination   [] Hematuria Skin:  [] Rashes   [] Ulcers   [] Wounds Psychological:  [] History of anxiety   []  History of major depression.    Physical Exam BP (!) 158/89    Pulse 76    Ht 5\' 8"  (1.727 m)    Wt 214 lb (97.1 kg)    LMP  (LMP Unknown)    BMI 32.54 kg/m  Gen:  WD/WN, NAD Head: /AT, No  temporalis wasting.  Ear/Nose/Throat: Hearing grossly intact, nares w/o erythema or drainage, oropharynx w/o Erythema/Exudate Eyes: Conjunctiva clear, sclera non-icteric  Neck: trachea midline.  No JVD.  Pulmonary:  Good air movement, respirations not labored, no use of accessory muscles  Cardiac: RRR, no JVD Vascular:  Vessel Right Left  Radial Palpable Palpable                          DP NP 1+  PT Trace  1+   Gastrointestinal:. No masses, surgical incisions, or scars. Musculoskeletal: M/S 5/5 throughout.  Extremities without ischemic changes.  No deformity or atrophy. Mild BLE edema. Neurologic: Sensation grossly intact in extremities.  Symmetrical.  Speech is fluent. Motor exam as listed above. Psychiatric: Judgment intact, Mood & affect appropriate for pt's clinical situation. Dermatologic: No rashes or ulcers noted.  No cellulitis or open wounds.    Radiology No results found.  Labs No results found for this or any previous visit (from the past 2160 hour(s)).  Assessment/Plan:  Atherosclerosis of native arteries of extremity with rest pain [facility]) The patient describes symptoms concerning for atherosclerosis with rest pain of the right foot.  Although I do not have the studies, her family reports that her primary care physician noticed very poor circulation in the right leg as well on noninvasive studies.  She may also have some degree of neuropathic pain after her stroke, but seems to be the atherosclerosis of the primary cause.  I discussed with them that this is a critical and limb threatening situation.  I discussed the pathophysiology and natural history of peripheral arterial disease.  Given her advanced disease state, I would recommend proceeding with angiogram with possible revascularization.  I also discussed some lesions are more amenable to open surgical therapy than they are percutaneous revascularization, but if percutaneous revascularization is an option we will  try to do that concomitant to her initial angiogram.  Patient and her daughter voiced their understanding and are agreeable to proceed.  She is on appropriate medical therapy with Plavix and Lipitor.  Benign essential HTN blood pressure control important in reducing the progression of atherosclerotic disease. On appropriate oral medications.   Right sided weakness After her stroke.  The neuropathic issues may be contributing somewhat to the pain, but it would appear that atherosclerosis is the primary cause.  Diabetes ([place]) blood glucose control important in reducing the progression of atherosclerotic disease. Also, involved  in wound healing. On appropriate medications.      [name] 02/09/2021, 2:47 PM   This note was created with Dragon medical transcription system.  Any errors from dictation are unintentional.

## 2021-02-09 NOTE — Assessment & Plan Note (Signed)
After her stroke.  The neuropathic issues may be contributing somewhat to the pain, but it would appear that atherosclerosis is the primary cause.

## 2021-02-09 NOTE — Patient Instructions (Signed)
Endovascular Therapy for Peripheral Vascular Disease, Care After The following information offers guidance on how to care for yourself after your procedure. Your health care provider may also give you more specific instructions. If you have problems or questions, contact your health careprovider. What can I expect after the procedure? After the procedure, it is common to have: Pain. Soreness and bruising around your puncture or incision (access site). Fatigue. Follow these instructions at home: Access site care  Follow instructions from your health care provider about how to take care of your access site. Make sure you: Wash your hands with soap and water for at least 20 seconds before and after you change your bandage (dressing). If soap and water are not available, use hand sanitizer. Change your dressing as told by your health care provider. Leave stitches (sutures), skin glue, or adhesive strips in place. If adhesive strip edges start to loosen and curl up, you may trim the loose edges. Do not remove adhesive strips or skin glue completely unless your health care provider tells you to do that. Check your access site every day for signs of infection. Check for: More redness, swelling, or pain. A lump or bump. Fluid or blood. Warmth. Pus or a bad smell.  Medicines Take over-the-counter and prescription medicines only as told by your health care provider. You may need to take medicines to prevent blood clots and to lower your cholesterol. If you were prescribed an antibiotic medicine, take it as told by your health care provider. Do not stop taking the antibiotic even if you start to feel better. Driving Do not drive until your health care provider approves. If you were given a sedative during the procedure, it can affect you for several hours. Do not drive or operate machinery until your health care provider says that it is safe. Ask your health care provider if the medicine prescribed  to you requires you to avoid driving or using machinery. Activity Rest as told by your health care provider. Avoid sitting for a long time without moving. Get up to take short walks every 1-2 hours. This is important to improve blood flow and breathing. Ask for help if you feel weak or unsteady. Do not lift anything that is heavier than 10 lb (4.5 kg), or the limit that you are told, until your health care provider says that it is safe. Avoid activity that requires a lot of effort, such as exercise and sports, as told by your health care provider. Avoid sexual activity until your health care provider says it is safe. Follow your exercise plan as told by your health care provider. Return to your normal activities as told by your health care provider. Ask your health care provider what activities are safe for you. Eating and drinking Drink fluids as instructed to help flush out the dye used during the procedure. Follow instructions from your health care provider about eating or drinking restrictions. You may need to eat a diet that is low in salt (sodium) and fat. Avoid drinking alcohol. General instructions Do not take baths, swim, or use a hot tub until your health care provider approves. Ask your health care provider if you may take showers. You may only be allowed to take sponge baths. Do not use any products that contain nicotine or tobacco. These products include cigarettes, chewing tobacco, and vaping devices, such as e-cigarettes. If you need help quitting, ask your health care provider. Keep all follow-up visits. This is important. Contact a health care   provider if: You have a fever. You have severe pain that does not get better with medicine. You have more redness, swelling, or pain around your access site. You have a lump or bump at your access site. Get help right away if:  You have fluid or blood coming from your access site. If this happens, lie down on your back and apply  pressure to the area. You have chest pain. You have problems breathing. You have pain, numbness, or tingling in your legs. You faint. You have any symptoms of a stroke. "BE FAST" is an easy way to remember the main warning signs of a stroke: B - Balance. Signs are dizziness, sudden trouble walking, or loss of balance. E - Eyes. Signs are trouble seeing or a sudden change in vision. F - Face. Signs are sudden weakness or numbness of the face, or the face or eyelid drooping on one side. A - Arms. Signs are weakness or numbness in an arm. This happens suddenly and usually on one side of the body. S - Speech. Signs are sudden trouble speaking, slurred speech, or trouble understanding what people say. T - Time. Time to call emergency services. Write down what time symptoms started. You have other signs of a stroke, such as: A sudden, severe headache with no known cause. Nausea or vomiting. Seizure. These symptoms may represent a serious problem that is an emergency. Do not wait to see if the symptoms will go away. Get medical help right away. Call your local emergency services (911 in the [place] Do not drive yourself to the hospital. Summary After the procedure, it is common to have pain and soreness near your puncture or incision (access site). Check your access site every day for signs of infection, such as redness, swelling, or pain. You may need to take medicines to prevent blood clots and to lower your cholesterol. If you have any signs of a stroke, get help right away. This information is not intended to replace advice given to you by your health care provider. Make sure you discuss any questions you have with your healthcare provider. Document Revised: 08/12/2019 Document Reviewed: 08/12/2019 Elsevier Patient Education  2022 [employer]   

## 2021-02-09 NOTE — Assessment & Plan Note (Signed)
blood glucose control important in reducing the progression of atherosclerotic disease. Also, involved in wound healing. On appropriate medications.  

## 2021-02-09 NOTE — H&P (View-Only) (Signed)
Patient ID: Terri Burnett, female   DOB: 09-Oct-1955, 65 y.o.   MRN: [id number]  Chief Complaint  Patient presents with   New Patient (Initial Visit)    NP consult PVD screening  done  found and found to be significant . Referred  by Ranae Plumber     HPI Terri Burnett is a 65 y.o. female.  I am asked to see the patient by Ranae Plumber for evaluation of peripheral arterial disease.  The patient has been having problems with her right leg now for many months and maybe more than a year.  She was having pain with walking at least a year ago.  This was mostly in the calf and lower leg with activity.  She is now to the point where she has pain in her foot if she lays flat on the bed.  No open wounds or infection.  No other symptoms with the left leg.  Although I do not have the studies for my review, her family reports that she had noninvasive studies done by her primary care physician which showed very poor circulation on the right.  Given that finding, she is referred for further evaluation and treatment.     Past Medical History:  Diagnosis Date   Diabetes mellitus without complication ([place])    Hypertension    Stroke ([place])     No past surgical history on file.  Family History No reported history of bleeding disorders, clotting disorders, aneurysms, or porphyria although patient really cannot provide history herself.   Social History   Tobacco Use   Smoking status: Former    Packs/day: 1.00    Years: 34.00    Pack years: 34.00    Types: Cigarettes    Quit date: 2009    Years since quitting: 13.9   Smokeless tobacco: Never  Substance Use Topics   Alcohol use: No   Drug use: No    No Known Allergies  Current Outpatient Medications  Medication Sig Dispense Refill   amLODipine (NORVASC) 10 MG tablet Take 10 mg by mouth daily.     atorvastatin (LIPITOR) 40 MG tablet Take 40 mg by mouth at bedtime.      clopidogrel (PLAVIX) 75 MG tablet Take 75 mg by  mouth daily.     lacosamide (VIMPAT) 50 MG TABS tablet Take 1 tablet (50 mg total) by mouth 2 (two) times daily. 60 tablet 1   losartan (COZAAR) 100 MG tablet Take 100 mg by mouth daily.     metFORMIN (GLUCOPHAGE-XR) 500 MG 24 hr tablet Take 500 mg by mouth daily with breakfast.     No current facility-administered medications for this visit.      REVIEW OF SYSTEMS (Negative unless checked)  Constitutional: [] Weight loss  [] Fever  [] Chills Cardiac: [] Chest pain   [] Chest pressure   [] Palpitations   [] Shortness of breath when laying flat   [] Shortness of breath at rest   [x] Shortness of breath with exertion. Vascular:  [x] Pain in legs with walking   [] Pain in legs at rest   [] Pain in legs when laying flat   [x] Claudication   [] Pain in feet when walking  [x] Pain in feet at rest  [] Pain in feet when laying flat   [] History of DVT   [] Phlebitis   [] Swelling in legs   [] Varicose veins   [] Non-healing ulcers Pulmonary:   [] Uses home oxygen   [] Productive cough   [] Hemoptysis   [] Wheeze  [] COPD   [] Asthma Neurologic:  []   Dizziness  [] Blackouts   [] Seizures   [] History of stroke   [] History of TIA  [] Aphasia   [] Temporary blindness   [] Dysphagia   [] Weakness or numbness in arms   [] Weakness or numbness in legs Musculoskeletal:  [x] Arthritis   [] Joint swelling   [x] Joint pain   [] Low back pain Hematologic:  [] Easy bruising  [] Easy bleeding   [] Hypercoagulable state   [] Anemic  [] Hepatitis Gastrointestinal:  [] Blood in stool   [] Vomiting blood  [x] Gastroesophageal reflux/heartburn   [] Abdominal pain Genitourinary:  [] Chronic kidney disease   [] Difficult urination  [] Frequent urination  [] Burning with urination   [] Hematuria Skin:  [] Rashes   [] Ulcers   [] Wounds Psychological:  [] History of anxiety   []  History of major depression.    Physical Exam BP (!) 158/89    Pulse 76    Ht 5\' 8"  (1.727 m)    Wt 214 lb (97.1 kg)    LMP  (LMP Unknown)    BMI 32.54 kg/m  Gen:  WD/WN, NAD Head: [place]/AT, No  temporalis wasting.  Ear/Nose/Throat: Hearing grossly intact, nares w/o erythema or drainage, oropharynx w/o Erythema/Exudate Eyes: Conjunctiva clear, sclera non-icteric  Neck: trachea midline.  No JVD.  Pulmonary:  Good air movement, respirations not labored, no use of accessory muscles  Cardiac: RRR, no JVD Vascular:  Vessel Right Left  Radial Palpable Palpable                          DP NP 1+  PT Trace  1+   Gastrointestinal:. No masses, surgical incisions, or scars. Musculoskeletal: M/S 5/5 throughout.  Extremities without ischemic changes.  No deformity or atrophy. Mild BLE edema. Neurologic: Sensation grossly intact in extremities.  Symmetrical.  Speech is fluent. Motor exam as listed above. Psychiatric: Judgment intact, Mood & affect appropriate for pt's clinical situation. Dermatologic: No rashes or ulcers noted.  No cellulitis or open wounds.    Radiology No results found.  Labs No results found for this or any previous visit (from the past 2160 hour(s)).  Assessment/Plan:  Atherosclerosis of native arteries of extremity with rest pain [facility]) The patient describes symptoms concerning for atherosclerosis with rest pain of the right foot.  Although I do not have the studies, her family reports that her primary care physician noticed very poor circulation in the right leg as well on noninvasive studies.  She may also have some degree of neuropathic pain after her stroke, but seems to be the atherosclerosis of the primary cause.  I discussed with them that this is a critical and limb threatening situation.  I discussed the pathophysiology and natural history of peripheral arterial disease.  Given her advanced disease state, I would recommend proceeding with angiogram with possible revascularization.  I also discussed some lesions are more amenable to open surgical therapy than they are percutaneous revascularization, but if percutaneous revascularization is an option we will  try to do that concomitant to her initial angiogram.  Patient and her daughter voiced their understanding and are agreeable to proceed.  She is on appropriate medical therapy with Plavix and Lipitor.  Benign essential HTN blood pressure control important in reducing the progression of atherosclerotic disease. On appropriate oral medications.   Right sided weakness After her stroke.  The neuropathic issues may be contributing somewhat to the pain, but it would appear that atherosclerosis is the primary cause.  Diabetes ([place]) blood glucose control important in reducing the progression of atherosclerotic disease. Also, involved  in wound healing. On appropriate medications.      [name] 02/09/2021, 2:47 PM   This note was created with Dragon medical transcription system.  Any errors from dictation are unintentional.

## 2021-02-16 ENCOUNTER — Other Ambulatory Visit (INDEPENDENT_AMBULATORY_CARE_PROVIDER_SITE_OTHER): Payer: Self-pay | Admitting: Nurse Practitioner

## 2021-02-16 ENCOUNTER — Telehealth (INDEPENDENT_AMBULATORY_CARE_PROVIDER_SITE_OTHER): Payer: Self-pay

## 2021-02-16 DIAGNOSIS — I70221 Atherosclerosis of native arteries of extremities with rest pain, right leg: Secondary | ICD-10-CM

## 2021-02-16 NOTE — Telephone Encounter (Signed)
Spoke with the patient's daughter and let her know that the insurance is stating she is not active for next year so they will not do a prior auth for the procedure that was scheduled for tomorrow. That procedure has been canceled for now and the daughter and patient are aware of this.

## 2021-02-17 ENCOUNTER — Other Ambulatory Visit: Payer: Self-pay

## 2021-02-17 ENCOUNTER — Encounter
Admission: RE | Disposition: A | Discharge status: Home / Self Care | Payer: Self-pay | Source: Ambulatory Visit | Attending: Vascular Surgery

## 2021-02-17 ENCOUNTER — Ambulatory Visit
Admission: RE | Admit: 2021-02-17 | Discharge: 2021-02-17 | Disposition: A | Discharge status: Home / Self Care | Payer: Medicare Other | Source: Ambulatory Visit | Attending: Vascular Surgery | Admitting: Vascular Surgery

## 2021-02-17 ENCOUNTER — Encounter: Payer: Self-pay | Admitting: Vascular Surgery

## 2021-02-17 DIAGNOSIS — Z8673 Personal history of transient ischemic attack (TIA), and cerebral infarction without residual deficits: Secondary | ICD-10-CM | POA: Insufficient documentation

## 2021-02-17 DIAGNOSIS — I70221 Atherosclerosis of native arteries of extremities with rest pain, right leg: Secondary | ICD-10-CM | POA: Diagnosis not present

## 2021-02-17 DIAGNOSIS — M79671 Pain in right foot: Secondary | ICD-10-CM

## 2021-02-17 DIAGNOSIS — Z7902 Long term (current) use of antithrombotics/antiplatelets: Secondary | ICD-10-CM | POA: Diagnosis not present

## 2021-02-17 DIAGNOSIS — E1151 Type 2 diabetes mellitus with diabetic peripheral angiopathy without gangrene: Secondary | ICD-10-CM | POA: Insufficient documentation

## 2021-02-17 DIAGNOSIS — R531 Weakness: Secondary | ICD-10-CM | POA: Diagnosis not present

## 2021-02-17 DIAGNOSIS — Z79899 Other long term (current) drug therapy: Secondary | ICD-10-CM | POA: Diagnosis not present

## 2021-02-17 DIAGNOSIS — I70201 Unspecified atherosclerosis of native arteries of extremities, right leg: Secondary | ICD-10-CM

## 2021-02-17 DIAGNOSIS — I10 Essential (primary) hypertension: Secondary | ICD-10-CM | POA: Diagnosis not present

## 2021-02-17 DIAGNOSIS — I70229 Atherosclerosis of native arteries of extremities with rest pain, unspecified extremity: Secondary | ICD-10-CM

## 2021-02-17 HISTORY — PX: LOWER EXTREMITY ANGIOGRAPHY: CATH118251

## 2021-02-17 LAB — CREATININE, SERUM
Creatinine, Ser: 0.86 mg/dL (ref 0.44–1.00)
GFR, Estimated: >60 mL/min (ref >60)

## 2021-02-17 LAB — BUN: BUN: 14 mg/dL (ref 8–23)

## 2021-02-17 LAB — GLUCOSE, CAPILLARY
Glucose-Capillary: 112 mg/dL — ABNORMAL HIGH (ref 70–99)
Glucose-Capillary: 94 mg/dL (ref 70–99)

## 2021-02-17 SURGERY — LOWER EXTREMITY ANGIOGRAPHY
Anesthesia: Moderate Sedation | Site: Leg Lower | Laterality: Right

## 2021-02-17 MED ORDER — DIPHENHYDRAMINE HCL 50 MG/ML IJ SOLN: 50.0000 mg | Freq: Once | INTRAMUSCULAR | Status: DC | PRN

## 2021-02-17 MED ORDER — ONDANSETRON HCL 4 MG/2ML IJ SOLN: 4.0000 mg | Freq: Four times a day (QID) | INTRAMUSCULAR | Status: DC | PRN

## 2021-02-17 MED ORDER — CEFAZOLIN SODIUM-DEXTROSE 2-4 GM/100ML-% IV SOLN: 2.0000 g | Freq: Once | INTRAVENOUS | Status: AC

## 2021-02-17 MED ORDER — FENTANYL CITRATE (PF) 100 MCG/2ML IJ SOLN
INTRAMUSCULAR | Status: DC | PRN
  Administered 2021-02-17: 50 ug via INTRAVENOUS

## 2021-02-17 MED ORDER — HEPARIN SODIUM (PORCINE) 1000 UNIT/ML IJ SOLN
INTRAMUSCULAR | Status: AC
  Filled 2021-02-17: qty 10

## 2021-02-17 MED ORDER — CEFAZOLIN SODIUM-DEXTROSE 2-4 GM/100ML-% IV SOLN
INTRAVENOUS | Status: AC
  Administered 2021-02-17: 13:00:00 2 g via INTRAVENOUS
  Filled 2021-02-17: qty 100

## 2021-02-17 MED ORDER — MIDAZOLAM HCL 2 MG/2ML IJ SOLN
INTRAMUSCULAR | Status: DC | PRN
  Administered 2021-02-17: 2 mg via INTRAVENOUS

## 2021-02-17 MED ORDER — SODIUM CHLORIDE 0.9 % IV SOLN: INTRAVENOUS | Status: DC

## 2021-02-17 MED ORDER — MIDAZOLAM HCL 2 MG/2ML IJ SOLN
INTRAMUSCULAR | Status: AC
  Filled 2021-02-17: qty 4

## 2021-02-17 MED ORDER — METHYLPREDNISOLONE SODIUM SUCC 125 MG IJ SOLR: 125.0000 mg | Freq: Once | INTRAMUSCULAR | Status: DC | PRN

## 2021-02-17 MED ORDER — FAMOTIDINE 20 MG PO TABS: 40.0000 mg | ORAL_TABLET | Freq: Once | ORAL | Status: DC | PRN

## 2021-02-17 MED ORDER — FENTANYL CITRATE (PF) 100 MCG/2ML IJ SOLN
INTRAMUSCULAR | Status: AC
  Filled 2021-02-17: qty 2

## 2021-02-17 MED ORDER — MIDAZOLAM HCL 2 MG/ML PO SYRP: 8.0000 mg | ORAL_SOLUTION | Freq: Once | ORAL | Status: DC | PRN

## 2021-02-17 MED ORDER — HYDROMORPHONE HCL 1 MG/ML IJ SOLN: 1.0000 mg | Freq: Once | INTRAMUSCULAR | Status: DC | PRN

## 2021-02-17 SURGICAL SUPPLY — 10 items
CATH BEACON 5 .035 65 KMP TIP (CATHETERS) ×2 IMPLANT
CATH PIG 70CM (CATHETERS) ×2 IMPLANT
COVER PROBE U/S 5X48 (MISCELLANEOUS) ×2 IMPLANT
DEVICE STARCLOSE SE CLOSURE (Vascular Products) ×2 IMPLANT
GLIDEWIRE ADV .035X260CM (WIRE) ×2 IMPLANT
PACK ANGIOGRAPHY (CUSTOM PROCEDURE TRAY) ×3 IMPLANT
SHEATH PINNACLE 5F 10CM (SHEATH) ×2 IMPLANT
SYR MEDRAD MARK 7 150ML (SYRINGE) ×2 IMPLANT
TUBING CONTRAST HIGH PRESS 72 (TUBING) ×2 IMPLANT
WIRE GUIDERIGHT .035X150 (WIRE) ×2 IMPLANT

## 2021-02-17 NOTE — Interval H&P Note (Signed)
History and Physical Interval Note:  02/17/2021 9:47 AM  Terri Burnett  has presented today for surgery, with the diagnosis of RT Lower Extremity Angiography   ASO with rest pain   BARD Rep cc: Loni Muse.  The various methods of treatment have been discussed with the patient and family. After consideration of risks, benefits and other options for treatment, the patient has consented to  Procedure(s): LOWER EXTREMITY ANGIOGRAPHY (Right) as a surgical intervention.  The patient's history has been reviewed, patient examined, no change in status, stable for surgery.  I have reviewed the patient's chart and labs.  Questions were answered to the patient's satisfaction.     [name]

## 2021-02-17 NOTE — Op Note (Signed)
[facility]  Percutaneous Study/Intervention Procedural Note   Date of Surgery: 02/17/2021  Surgeon(s):Joselynne Killam    Assistants:none  Pre-operative Diagnosis: PAD with pain in right foot worrisome for rest pain  Post-operative diagnosis:  Same but the PAD was very mild and the pain was unlikely due to rest pain  Procedure(s) Performed:             1.  Ultrasound guidance for vascular access left femoral artery             2.  Catheter placement into right SFA from left femoral             3.  Aortogram and selective right lower extremity angiogram             4.  StarClose closure device left femoral artery  EBL: 10 cc  Contrast: 35 cc  Fluoro Time: 2.5 minutes  Moderate Conscious Sedation Time: approximately 27 minutes using 2 mg of Versed and 50 mcg of Fentanyl              Indications:  Patient is a 65 y.o.female with pain in her right foot. The patient has noninvasive study showing significant disease in the right leg at an outside institution that I was not able to review but by the patient and family report it showed significant disease. The patient is brought in for angiography for further evaluation and potential treatment.  Due to the limb threatening nature of the situation, angiogram was performed for attempted limb salvage. The patient is aware that if the procedure fails, amputation would be expected.  The patient also understands that even with successful revascularization, amputation may still be required due to the severity of the situation.  Risks and benefits are discussed and informed consent is obtained.   Procedure:  The patient was identified and appropriate procedural time out was performed.  The patient was then placed supine on the table and prepped and draped in the usual sterile fashion. Moderate conscious sedation was administered during a face to face encounter with the patient throughout the procedure with my supervision of the RN  administering medicines and monitoring the patient's vital signs, pulse oximetry, telemetry and mental status throughout from the start of the procedure until the patient was taken to the recovery room. Ultrasound was used to evaluate the left common femoral artery.  It was patent .  A digital ultrasound image was acquired.  A Seldinger needle was used to access the left common femoral artery under direct ultrasound guidance and a permanent image was performed.  A 0.035 J wire was advanced without resistance and a 5Fr sheath was placed.  Pigtail catheter was placed into the aorta and an AP aortogram was performed. This demonstrated normal right renal artery with what appeared to be a moderate stenosis of the left renal artery and normal aorta and iliac segments without significant stenosis. I then crossed the aortic bifurcation and advanced to the right femoral head. Selective right lower extremity angiogram was then performed.  The distal opacification was poor, and we had to exchanged for a Kumpe catheter down into the mid to distal right SFA to show the tibial vessels.  This demonstrated a normal common femoral artery, profunda femoris artery, and superficial femoral and popliteal arteries which were highly calcific but not stenotic.  No degree of stenosis appeared to be more than 25 to 30%.  The anterior tibial artery was the dominant runoff to the foot.  Although  circulation time was slow and it was sluggish to the foot, it was continuous without obvious focal stenosis.  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 It was felt her right foot pain was unlikely secondary to poor perfusion at least from blockages in her leg arteries.  Her poor circulation time could have contributed, but this was likely neuropathic in nature.  No endovascular revascularization would be of benefit.  I elected to terminate the procedure. The sheath was removed and StarClose  closure device was deployed in the left femoral artery with excellent hemostatic result. The patient was taken to the recovery room in stable condition having tolerated the procedure well.  Findings:               Aortogram:  This demonstrated normal right renal artery with what appeared to be a moderate stenosis of the left renal artery and normal aorta and iliac segments without significant stenosis.              Right Lower Extremity: This demonstrates a normal common femoral artery, profunda femoris artery, and superficial femoral and popliteal arteries which were highly calcific but not stenotic.  No degree of stenosis appeared to be more than 25 to 30%.  The anterior tibial artery was the dominant runoff to the foot.  Although circulation time was slow and it was sluggish to the foot, it was continuous without obvious focal stenosis.  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   Disposition: Patient was taken to the recovery room in stable condition having tolerated the procedure well.  Complications: None  [name] 02/17/2021 12:19 PM   This note was created with Dragon Medical transcription system. Any errors in dictation are purely unintentional.

## 2021-02-18 ENCOUNTER — Encounter: Payer: Self-pay | Admitting: Vascular Surgery

## 2021-12-11 ENCOUNTER — Emergency Department: Payer: Medicare Other

## 2021-12-11 ENCOUNTER — Emergency Department
Admission: EM | Admit: 2021-12-11 | Discharge: 2021-12-11 | Disposition: A | Discharge status: Home / Self Care | Payer: Medicare Other | Attending: Emergency Medicine | Admitting: Emergency Medicine

## 2021-12-11 DIAGNOSIS — X58XXXA Exposure to other specified factors, initial encounter: Secondary | ICD-10-CM | POA: Diagnosis not present

## 2021-12-11 DIAGNOSIS — R7989 Other specified abnormal findings of blood chemistry: Secondary | ICD-10-CM | POA: Insufficient documentation

## 2021-12-11 DIAGNOSIS — S99911A Unspecified injury of right ankle, initial encounter: Secondary | ICD-10-CM | POA: Diagnosis present

## 2021-12-11 DIAGNOSIS — S8291XA Unspecified fracture of right lower leg, initial encounter for closed fracture: Secondary | ICD-10-CM | POA: Diagnosis not present

## 2021-12-11 DIAGNOSIS — S82891A Other fracture of right lower leg, initial encounter for closed fracture: Secondary | ICD-10-CM

## 2021-12-11 LAB — CBC WITH DIFFERENTIAL/PLATELET
Abs Immature Granulocytes: 0.02 10*3/uL (ref 0.00–0.07)
Basophils Absolute: 0 10*3/uL (ref 0.0–0.1)
Basophils Relative: 1 %
Eosinophils Absolute: 0.1 10*3/uL (ref 0.0–0.5)
Eosinophils Relative: 1 %
HCT: 41.8 % (ref 36.0–46.0)
Hemoglobin: 12.9 g/dL (ref 12.0–15.0)
Immature Granulocytes: 0 %
Lymphocytes Relative: 19 %
Lymphs Abs: 1.2 10*3/uL (ref 0.7–4.0)
MCH: 30.4 pg (ref 26.0–34.0)
MCHC: 30.9 g/dL (ref 30.0–36.0)
MCV: 98.6 fL (ref 80.0–100.0)
Monocytes Absolute: 0.6 10*3/uL (ref 0.1–1.0)
Monocytes Relative: 9 %
Neutro Abs: 4.6 10*3/uL (ref 1.7–7.7)
Neutrophils Relative %: 70 %
Platelets: 221 10*3/uL (ref 150–400)
RBC: 4.24 MIL/uL (ref 3.87–5.11)
RDW: 13.8 % (ref 11.5–15.5)
WBC: 6.6 10*3/uL (ref 4.0–10.5)
nRBC: 0 % (ref 0.0–0.2)

## 2021-12-11 LAB — BASIC METABOLIC PANEL
Anion gap: 11 (ref 5–15)
BUN: 14 mg/dL (ref 8–23)
CO2: 20 mmol/L — ABNORMAL LOW (ref 22–32)
Calcium: 9.2 mg/dL (ref 8.9–10.3)
Chloride: 105 mmol/L (ref 98–111)
Creatinine, Ser: 1.1 mg/dL — ABNORMAL HIGH (ref 0.44–1.00)
GFR, Estimated: 55 mL/min — ABNORMAL LOW (ref >60)
Glucose, Bld: 217 mg/dL — ABNORMAL HIGH (ref 70–99)
Potassium: 4.1 mmol/L (ref 3.5–5.1)
Sodium: 136 mmol/L (ref 135–145)

## 2021-12-11 MED ORDER — ONDANSETRON HCL 4 MG/2ML IJ SOLN
4.0000 mg | Freq: Once | INTRAMUSCULAR | Status: AC
Start: 2021-12-11 — End: 2021-12-11
  Administered 2021-12-11: 4 mg via INTRAVENOUS
  Filled 2021-12-11: qty 2

## 2021-12-11 MED ORDER — FENTANYL CITRATE PF 50 MCG/ML IJ SOSY
50.0000 ug | PREFILLED_SYRINGE | Freq: Once | INTRAMUSCULAR | Status: AC
  Administered 2021-12-11: 50 ug via INTRAVENOUS
  Filled 2021-12-11: qty 1

## 2021-12-11 MED ORDER — OXYCODONE HCL 5 MG PO TABS: 5.0000 mg | ORAL_TABLET | Freq: Four times a day (QID) | ORAL | 0 refills | Status: AC | PRN

## 2021-12-11 MED ORDER — SODIUM CHLORIDE 0.9 % IV BOLUS
500.0000 mL | Freq: Once | INTRAVENOUS | Status: AC
  Administered 2021-12-11: 500 mL via INTRAVENOUS

## 2021-12-11 NOTE — Discharge Instructions (Addendum)
Please seek medical attention for any high fevers, chest pain, shortness of breath, change in behavior, persistent vomiting, bloody stool or any other new or concerning symptoms.  

## 2021-12-11 NOTE — ED Provider Notes (Signed)
[facility] Provider Note    Event Date/Time   First MD Initiated Contact with Patient 12/11/21 1844     (approximate)   History   Right ankle pain   HPI  Terri Burnett is a 66 y.o. female  who presents to the emergency department today with primary concern for right ankle pain. The patient had an episode of possible seizure or syncope when it occurred. She was standing at the time and fell. Denies any other injuries. Family at bedside state that she has not had a seizure in a while if that is what happened today. However she does tend to have syncope/seizures if she even laughs too much. She denies any recent illness. States she had felt in her normal state of health earlier in the day.      Physical Exam   Triage Vital Signs: ED Triage Vitals  Enc Vitals Group     BP 12/11/21 1817 (!) 157/103     Pulse Rate 12/11/21 1817 84     Resp 12/11/21 1817 19     Temp 12/11/21 1817 98.2 F (36.8 C)     Temp Source 12/11/21 1817 Oral     SpO2 12/11/21 1817 100 %     Weight 12/11/21 1823 211 lb (95.7 kg)     Height 12/11/21 1823 5\' 8"  (1.727 m)     Head Circumference --      Peak Flow --      Pain Score 12/11/21 1823 8     Pain Loc --      Pain Edu? --      Excl. in GC? --     Most recent vital signs: Vitals:   12/11/21 1817  BP: (!) 157/103  Pulse: 84  Resp: 19  Temp: 98.2 F (36.8 C)  SpO2: 100%    General: Awake, alert, oriented. CV:  Good peripheral perfusion. Regular rate and rhythm. Resp:  Normal effort. Lungs clear. Abd:  No distention.  Other:  Right ankle tender to palpation. DP 2+   ED Results / Procedures / Treatments   Labs (all labs ordered are listed, but only abnormal results are displayed) Labs Reviewed  BASIC METABOLIC PANEL - Abnormal; Notable for the following components:      Result Value   CO2 20 (*)    Glucose, Bld 217 (*)    Creatinine, Ser 1.10 (*)    GFR, Estimated 55 (*)    All other components  within normal limits  CBC WITH DIFFERENTIAL/PLATELET     EKG  None   RADIOLOGY I independently interpreted and visualized the right ankle. My interpretation: distal fibula fracture Radiology interpretation:  IMPRESSION:  Bimalleolar fracture with 3 mm lateral subluxation of the ankle  mortise.      PROCEDURES:  Critical Care performed: No  Procedures   MEDICATIONS ORDERED IN ED: Medications - No data to display   IMPRESSION / MDM / ASSESSMENT AND PLAN / ED COURSE  I reviewed the triage vital signs and the nursing notes.                              Differential diagnosis includes, but is not limited to, sprain, dislocation, fracture.  Patient's presentation is most consistent with acute presentation with potential threat to life or bodily function.  Patient presented to the emergency department today because of concerns for right ankle pain.  X-ray here does show bimalleolar  fracture.  Patient was splinted.  Will have patient follow-up with podiatry.  Blood work was also checked given possible syncopal episode.  Did show slight elevation patient's creatinine.  She was given IV fluids.  Otherwise no concerning electrolyte abnormalities.  No anemia.  Will discharge with prescription for pain medication.   FINAL CLINICAL IMPRESSION(S) / ED DIAGNOSES   Final diagnoses:  Closed fracture of right ankle, initial encounter     Note:  This document was prepared using Dragon voice recognition software and may include unintentional dictation errors.    Nance Pear, MD 12/11/21 [phone]

## 2021-12-11 NOTE — ED Notes (Signed)
Pt A&Ox4. Pt had seizure like activity and rolled her ankle. Pt endorsed pain 8/10. Pt's right ankle swollen.

## 2021-12-11 NOTE — ED Triage Notes (Signed)
Patient presents with injury to her RIGHT ankle after she had a witnessed seizure; History of seizures (denies missing any doses of medication or changes with medications), she states that her seizures can be triggered when she laughs really hard; A&O x 4 upon arrival here, only complaint is her ankle injury

## 2021-12-21 ENCOUNTER — Ambulatory Visit (INDEPENDENT_AMBULATORY_CARE_PROVIDER_SITE_OTHER): Payer: Medicare Other | Admitting: Podiatry

## 2021-12-21 DIAGNOSIS — S82891A Other fracture of right lower leg, initial encounter for closed fracture: Secondary | ICD-10-CM | POA: Diagnosis not present

## 2021-12-21 DIAGNOSIS — S93431A Sprain of tibiofibular ligament of right ankle, initial encounter: Secondary | ICD-10-CM | POA: Diagnosis not present

## 2021-12-23 ENCOUNTER — Telehealth: Payer: Self-pay | Admitting: Podiatry

## 2021-12-23 NOTE — Telephone Encounter (Addendum)
DOS: 01/04/2022  [employer]  Procedures: ORIF Ankle Fracture Rt (24097) and Syndesmotic Reduction Rt (35329)  DX: J24.268T and M19.622W  Deductible: $0 Out-of-Pocket: $8,300 with $8,275.41 remaining CoInsurance: 20%  Prior Authorization is Not required per [employer].   Decision ID #:[id number]

## 2021-12-24 NOTE — Progress Notes (Signed)
Subjective:  Patient ID: Terri Burnett, female    DOB: Jan 29, 1956,  MRN: [id number]  Chief Complaint  Patient presents with   Fracture    66 y.o. female presents with the above complaint.  Patient presents with complaint of right ankle fracture.  Patient states that she may have had a seizure or syncope when the fracture happened she fell down and twisted her foot.  She does not recall the exact mechanism.  She denies any other illness.  She feels within the normal state of mind today.  She went to the emergency room where they immobilized her and discharged her on pain medication.  She would like to discuss next treatment plan denies any other acute complaints.   Review of Systems: Negative except as noted in the HPI. Denies N/V/F/Ch.  Past Medical History:  Diagnosis Date   Diabetes mellitus without complication ([place])    Hypertension    Stroke [facility])     Current Outpatient Medications:    amLODipine (NORVASC) 10 MG tablet, Take 10 mg by mouth daily., Disp: , Rfl:    atorvastatin (LIPITOR) 40 MG tablet, Take 40 mg by mouth at bedtime. , Disp: , Rfl:    clopidogrel (PLAVIX) 75 MG tablet, Take 75 mg by mouth daily., Disp: , Rfl:    lacosamide (VIMPAT) 50 MG TABS tablet, Take 1 tablet (50 mg total) by mouth 2 (two) times daily., Disp: 60 tablet, Rfl: 1   losartan (COZAAR) 100 MG tablet, Take 100 mg by mouth daily., Disp: , Rfl:    metFORMIN (GLUCOPHAGE-XR) 500 MG 24 hr tablet, Take 500 mg by mouth daily with breakfast., Disp: , Rfl:    oxyCODONE (ROXICODONE) 5 MG immediate release tablet, Take 1 tablet (5 mg total) by mouth every 6 (six) hours as needed for severe pain., Disp: 20 tablet, Rfl: 0  Social History   Tobacco Use  Smoking Status Former   Packs/day: 1.00   Years: 34.00   Total pack years: 34.00   Types: Cigarettes   Quit date: 2009   Years since quitting: 14.8  Smokeless Tobacco Never    No Known Allergies Objective:  There were no vitals filed for this  visit. There is no height or weight on file to calculate BMI. Constitutional Well developed. Well nourished.  Vascular Dorsalis pedis pulses palpable bilaterally. Posterior tibial pulses palpable bilaterally. Capillary refill normal to all digits.  No cyanosis or clubbing noted. Pedal hair growth normal.  Neurologic Normal speech. Oriented to person, place, and time. Epicritic sensation to light touch grossly present bilaterally.  Dermatologic Nails well groomed and normal in appearance. No open wounds. No skin lesions.  Orthopedic: Pain on palpation of medial lateral ankle.  No open wounds or lesion noted.  Pain with range of motion of the ankle joint.  No calf pain noted.  No signs of compartment syndrome.  Motor or sensory functions are intact.   Radiographs: FINDINGS: Mildly displaced oblique fracture of the distal fibula with approximately 4 mm lateral displacement of the distal fracture fragment. There is cortical avulsion of the medial malleolus. There is widening of the medial ankle mortise with approximately 3 mm lateral subluxation of the ankle. There is soft tissue swelling of the ankle. No radiopaque foreign object or subcu gas.   IMPRESSION: Bimalleolar fracture with 3 mm lateral subluxation of the ankle mortise. Assessment:   1. Closed fracture of right ankle, initial encounter   2. Syndesmotic disruption of right ankle, initial encounter    Plan:  Patient was evaluated and treated and all questions answered.  Right ankle fracture bimalleolar equivalent fracture -All questions and concerns were discussed with the patient in extensive detail.  I reviewed the radiographs given that there is an increase in medial clear space with some subluxation of the ankle joint this makes it unstable ankle fracture and therefore patient will need open reduction internal fixation of ankle fracture with reduction of fibular as well as a possible syndesmotic repair.  I discussed this  with patient in extensive detail I discussed the preoperative postoperative intraoperative plan in extensive detail she will need to be nonweightbearing after the surgery she states understanding. -Informed surgical risk consent was reviewed and read aloud to the patient.  I reviewed the films.  I have discussed my findings with the patient in great detail.  I have discussed all risks including but not limited to infection, stiffness, scarring, limp, disability, deformity, damage to blood vessels and nerves, numbness, poor healing, need for braces, arthritis, chronic pain, amputation, death.  All benefits and realistic expectations discussed in great detail.  I have made no promises as to the outcome.  I have provided realistic expectations.  I have offered the patient a 2nd opinion, which they have declined and assured me they preferred to proceed despite the risks   No follow-ups on file.

## 2021-12-24 NOTE — H&P (View-Only) (Signed)
Subjective:  Patient ID: Terri Burnett, female    DOB: Jan 29, 1956,  MRN: [id number]  Chief Complaint  Patient presents with   Fracture    65 y.o. female presents with the above complaint.  Patient presents with complaint of right ankle fracture.  Patient states that she may have had a seizure or syncope when the fracture happened she fell down and twisted her foot.  She does not recall the exact mechanism.  She denies any other illness.  She feels within the normal state of mind today.  She went to the emergency room where they immobilized her and discharged her on pain medication.  She would like to discuss next treatment plan denies any other acute complaints.   Review of Systems: Negative except as noted in the HPI. Denies N/V/F/Ch.  Past Medical History:  Diagnosis Date   Diabetes mellitus without complication ([place])    Hypertension    Stroke [facility])     Current Outpatient Medications:    amLODipine (NORVASC) 10 MG tablet, Take 10 mg by mouth daily., Disp: , Rfl:    atorvastatin (LIPITOR) 40 MG tablet, Take 40 mg by mouth at bedtime. , Disp: , Rfl:    clopidogrel (PLAVIX) 75 MG tablet, Take 75 mg by mouth daily., Disp: , Rfl:    lacosamide (VIMPAT) 50 MG TABS tablet, Take 1 tablet (50 mg total) by mouth 2 (two) times daily., Disp: 60 tablet, Rfl: 1   losartan (COZAAR) 100 MG tablet, Take 100 mg by mouth daily., Disp: , Rfl:    metFORMIN (GLUCOPHAGE-XR) 500 MG 24 hr tablet, Take 500 mg by mouth daily with breakfast., Disp: , Rfl:    oxyCODONE (ROXICODONE) 5 MG immediate release tablet, Take 1 tablet (5 mg total) by mouth every 6 (six) hours as needed for severe pain., Disp: 20 tablet, Rfl: 0  Social History   Tobacco Use  Smoking Status Former   Packs/day: 1.00   Years: 34.00   Total pack years: 34.00   Types: Cigarettes   Quit date: 2009   Years since quitting: 14.8  Smokeless Tobacco Never    No Known Allergies Objective:  There were no vitals filed for this  visit. There is no height or weight on file to calculate BMI. Constitutional Well developed. Well nourished.  Vascular Dorsalis pedis pulses palpable bilaterally. Posterior tibial pulses palpable bilaterally. Capillary refill normal to all digits.  No cyanosis or clubbing noted. Pedal hair growth normal.  Neurologic Normal speech. Oriented to person, place, and time. Epicritic sensation to light touch grossly present bilaterally.  Dermatologic Nails well groomed and normal in appearance. No open wounds. No skin lesions.  Orthopedic: Pain on palpation of medial lateral ankle.  No open wounds or lesion noted.  Pain with range of motion of the ankle joint.  No calf pain noted.  No signs of compartment syndrome.  Motor or sensory functions are intact.   Radiographs: FINDINGS: Mildly displaced oblique fracture of the distal fibula with approximately 4 mm lateral displacement of the distal fracture fragment. There is cortical avulsion of the medial malleolus. There is widening of the medial ankle mortise with approximately 3 mm lateral subluxation of the ankle. There is soft tissue swelling of the ankle. No radiopaque foreign object or subcu gas.   IMPRESSION: Bimalleolar fracture with 3 mm lateral subluxation of the ankle mortise. Assessment:   1. Closed fracture of right ankle, initial encounter   2. Syndesmotic disruption of right ankle, initial encounter    Plan:  Patient was evaluated and treated and all questions answered.  Right ankle fracture bimalleolar equivalent fracture -All questions and concerns were discussed with the patient in extensive detail.  I reviewed the radiographs given that there is an increase in medial clear space with some subluxation of the ankle joint this makes it unstable ankle fracture and therefore patient will need open reduction internal fixation of ankle fracture with reduction of fibular as well as a possible syndesmotic repair.  I discussed this  with patient in extensive detail I discussed the preoperative postoperative intraoperative plan in extensive detail she will need to be nonweightbearing after the surgery she states understanding. -Informed surgical risk consent was reviewed and read aloud to the patient.  I reviewed the films.  I have discussed my findings with the patient in great detail.  I have discussed all risks including but not limited to infection, stiffness, scarring, limp, disability, deformity, damage to blood vessels and nerves, numbness, poor healing, need for braces, arthritis, chronic pain, amputation, death.  All benefits and realistic expectations discussed in great detail.  I have made no promises as to the outcome.  I have provided realistic expectations.  I have offered the patient a 2nd opinion, which they have declined and assured me they preferred to proceed despite the risks   No follow-ups on file.

## 2021-12-30 ENCOUNTER — Encounter
Admission: RE | Admit: 2021-12-30 | Discharge: 2021-12-30 | Disposition: A | Discharge status: Home / Self Care | Payer: Medicare Other | Source: Ambulatory Visit | Attending: Podiatry | Admitting: Podiatry

## 2021-12-30 ENCOUNTER — Other Ambulatory Visit: Payer: Self-pay

## 2021-12-30 VITALS — Ht 68.0 in | Wt 215.0 lb

## 2021-12-30 DIAGNOSIS — E1151 Type 2 diabetes mellitus with diabetic peripheral angiopathy without gangrene: Secondary | ICD-10-CM

## 2021-12-30 DIAGNOSIS — I70221 Atherosclerosis of native arteries of extremities with rest pain, right leg: Secondary | ICD-10-CM

## 2021-12-30 DIAGNOSIS — I6789 Other cerebrovascular disease: Secondary | ICD-10-CM

## 2021-12-30 DIAGNOSIS — I10 Essential (primary) hypertension: Secondary | ICD-10-CM

## 2021-12-30 NOTE — Patient Instructions (Signed)
Your procedure is scheduled on: 01/04/22 Report to [place]. To find out your arrival time please call [phone] between 1PM - 3PM on 01/03/22.  Remember: Instructions that are not followed completely may result in serious medical risk, up to and including death, or upon the discretion of your surgeon and anesthesiologist your surgery may need to be rescheduled.     _X__ 1. Do not eat food after midnight the night before your procedure.                 No gum chewing or hard candies. You may drink clear liquids up to 2 hours                 before you are scheduled to arrive for your surgery- DO not drink clear                 liquids within 2 hours of the start of your surgery.                 Clear Liquids include:   Diabetics water only  __X__2.  On the morning of surgery brush your teeth with toothpaste and water, you                 may rinse your mouth with mouthwash if you wish.  Do not swallow any              toothpaste of mouthwash.     _X__ 3.  No Alcohol for 24 hours before or after surgery.   _X__ 4.  Do Not Smoke or use e-cigarettes For 24 Hours Prior to Your Surgery.                 Do not use any chewable tobacco products for at least 6 hours prior to                 surgery.  ____  5.  Bring all medications with you on the day of surgery if instructed.   __X__  6.  Notify your doctor if there is any change in your medical condition      (cold, fever, infections).     Do not wear jewelry, make-up, hairpins, clips or nail polish. Do not wear lotions, powders, or perfumes. You may wear deodorant Do not shave body hair 48 hours prior to surgery. Men may shave face and neck. Do not bring valuables to the hospital.    [facility] is not responsible for any belongings or valuables.  Contacts, dentures/partials or body piercings may not be worn into surgery. Bring a case for your contacts, glasses or hearing aids,  a denture cup will be supplied. Leave your suitcase in the car. After surgery it may be brought to your room. For patients admitted to the hospital, discharge time is determined by your treatment team.   Patients discharged the day of surgery will not be allowed to drive home.   Please read over the following fact sheets that you were given:   Sage wipes  __X__ Take these medicines the morning of surgery with A SIP OF WATER:    1. amLODipine (NORVASC) 10 MG tablet   2. lacosamide (VIMPAT) 50 MG TABS tablet   3. oxyCODONE (ROXICODONE) 5 MG immediate release tablet  if needed  4.  5.  6.  ____ Fleet Enema (as directed)   __X__ Use CHG Soap/SAGE wipes as directed  ____ Use inhalers on the day of surgery  __X__ Stop metformin/Janumet/Farxiga 2 days prior to surgery  Last dose Sunday 01/02/22  ____ Take 1/2 of usual insulin dose the night before surgery. No insulin the morning          of surgery.   __X__ Stop Blood Thinners Coumadin/Plavix/Xarelto/Pleta/Pradaxa/Eliquis/Effient/Aspirin  AS INSTRUCTED (LAST DOSE 11/10)  __X__ Stop Anti-inflammatories 7 days before surgery such as Advil, Ibuprofen, Motrin,  BC or Goodies Powder, Naprosyn, Naproxen, Aleve, Aspirin    __X__ Stop all herbals and supplements, fish oil or vitamins  until after surgery.    ____ Bring C-Pap to the hospital.

## 2021-12-31 ENCOUNTER — Other Ambulatory Visit: Payer: Self-pay

## 2021-12-31 ENCOUNTER — Encounter: Payer: Self-pay | Admitting: Urgent Care

## 2021-12-31 ENCOUNTER — Encounter
Admission: RE | Admit: 2021-12-31 | Discharge: 2021-12-31 | Disposition: A | Discharge status: Home / Self Care | Payer: Medicare Other | Source: Ambulatory Visit | Attending: Orthopedic Surgery | Admitting: Orthopedic Surgery

## 2021-12-31 DIAGNOSIS — I70221 Atherosclerosis of native arteries of extremities with rest pain, right leg: Secondary | ICD-10-CM | POA: Insufficient documentation

## 2021-12-31 DIAGNOSIS — E1151 Type 2 diabetes mellitus with diabetic peripheral angiopathy without gangrene: Secondary | ICD-10-CM | POA: Insufficient documentation

## 2021-12-31 DIAGNOSIS — Z01812 Encounter for preprocedural laboratory examination: Secondary | ICD-10-CM | POA: Diagnosis present

## 2022-01-04 ENCOUNTER — Ambulatory Visit: Payer: Medicare Other | Admitting: Certified Registered Nurse Anesthetist

## 2022-01-04 ENCOUNTER — Encounter
Admission: RE | Disposition: A | Discharge status: Home / Self Care | Payer: Self-pay | Source: Home / Self Care | Attending: Podiatry

## 2022-01-04 ENCOUNTER — Other Ambulatory Visit: Payer: Self-pay

## 2022-01-04 ENCOUNTER — Ambulatory Visit: Payer: Medicare Other

## 2022-01-04 ENCOUNTER — Encounter: Payer: Self-pay | Admitting: Podiatry

## 2022-01-04 ENCOUNTER — Ambulatory Visit
Admission: RE | Admit: 2022-01-04 | Discharge: 2022-01-04 | Disposition: A | Discharge status: Home / Self Care | Payer: Medicare Other | Attending: Podiatry | Admitting: Podiatry

## 2022-01-04 ENCOUNTER — Other Ambulatory Visit: Payer: Self-pay | Admitting: Podiatry

## 2022-01-04 DIAGNOSIS — I10 Essential (primary) hypertension: Secondary | ICD-10-CM | POA: Diagnosis not present

## 2022-01-04 DIAGNOSIS — E119 Type 2 diabetes mellitus without complications: Secondary | ICD-10-CM | POA: Diagnosis not present

## 2022-01-04 DIAGNOSIS — I2510 Atherosclerotic heart disease of native coronary artery without angina pectoris: Secondary | ICD-10-CM | POA: Diagnosis not present

## 2022-01-04 DIAGNOSIS — Z87891 Personal history of nicotine dependence: Secondary | ICD-10-CM | POA: Diagnosis not present

## 2022-01-04 DIAGNOSIS — S82841A Displaced bimalleolar fracture of right lower leg, initial encounter for closed fracture: Secondary | ICD-10-CM | POA: Insufficient documentation

## 2022-01-04 DIAGNOSIS — Z7984 Long term (current) use of oral hypoglycemic drugs: Secondary | ICD-10-CM | POA: Diagnosis not present

## 2022-01-04 DIAGNOSIS — I70221 Atherosclerosis of native arteries of extremities with rest pain, right leg: Secondary | ICD-10-CM

## 2022-01-04 DIAGNOSIS — S82891A Other fracture of right lower leg, initial encounter for closed fracture: Secondary | ICD-10-CM | POA: Diagnosis not present

## 2022-01-04 DIAGNOSIS — Z8673 Personal history of transient ischemic attack (TIA), and cerebral infarction without residual deficits: Secondary | ICD-10-CM | POA: Insufficient documentation

## 2022-01-04 DIAGNOSIS — X58XXXA Exposure to other specified factors, initial encounter: Secondary | ICD-10-CM | POA: Insufficient documentation

## 2022-01-04 DIAGNOSIS — E1151 Type 2 diabetes mellitus with diabetic peripheral angiopathy without gangrene: Secondary | ICD-10-CM

## 2022-01-04 DIAGNOSIS — I6789 Other cerebrovascular disease: Secondary | ICD-10-CM

## 2022-01-04 DIAGNOSIS — I739 Peripheral vascular disease, unspecified: Secondary | ICD-10-CM | POA: Diagnosis not present

## 2022-01-04 DIAGNOSIS — S93431A Sprain of tibiofibular ligament of right ankle, initial encounter: Secondary | ICD-10-CM | POA: Diagnosis not present

## 2022-01-04 HISTORY — PX: ORIF ANKLE FRACTURE: SHX5408

## 2022-01-04 LAB — GLUCOSE, CAPILLARY
Glucose-Capillary: 120 mg/dL — ABNORMAL HIGH (ref 70–99)
Glucose-Capillary: 123 mg/dL — ABNORMAL HIGH (ref 70–99)

## 2022-01-04 SURGERY — OPEN REDUCTION INTERNAL FIXATION (ORIF) ANKLE FRACTURE
Anesthesia: General | Site: Ankle | Laterality: Right

## 2022-01-04 MED ORDER — IBUPROFEN 800 MG PO TABS: 800.0000 mg | ORAL_TABLET | Freq: Four times a day (QID) | ORAL | 1 refills | Status: AC | PRN

## 2022-01-04 MED ORDER — ROCURONIUM BROMIDE 100 MG/10ML IV SOLN
INTRAVENOUS | Status: DC | PRN
  Administered 2022-01-04: 50 mg via INTRAVENOUS

## 2022-01-04 MED ORDER — ORAL CARE MOUTH RINSE: 15.0000 mL | Freq: Once | OROMUCOSAL | Status: AC

## 2022-01-04 MED ORDER — PROPOFOL 10 MG/ML IV BOLUS
INTRAVENOUS | Status: AC
  Filled 2022-01-04: qty 20

## 2022-01-04 MED ORDER — BUPIVACAINE HCL (PF) 0.5 % IJ SOLN
INTRAMUSCULAR | Status: DC | PRN
  Administered 2022-01-04: 20 mL

## 2022-01-04 MED ORDER — ONDANSETRON HCL 4 MG/2ML IJ SOLN
INTRAMUSCULAR | Status: DC | PRN
  Administered 2022-01-04: 4 mg via INTRAVENOUS

## 2022-01-04 MED ORDER — FENTANYL CITRATE (PF) 100 MCG/2ML IJ SOLN
INTRAMUSCULAR | Status: AC
  Filled 2022-01-04: qty 2

## 2022-01-04 MED ORDER — CHLORHEXIDINE GLUCONATE 0.12 % MT SOLN
OROMUCOSAL | Status: AC
  Administered 2022-01-04: 15 mL via OROMUCOSAL
  Filled 2022-01-04: qty 15

## 2022-01-04 MED ORDER — SUGAMMADEX SODIUM 200 MG/2ML IV SOLN
INTRAVENOUS | Status: DC | PRN
  Administered 2022-01-04: 200 mg via INTRAVENOUS

## 2022-01-04 MED ORDER — FENTANYL CITRATE (PF) 100 MCG/2ML IJ SOLN
INTRAMUSCULAR | Status: DC | PRN
  Administered 2022-01-04: 100 ug via INTRAVENOUS
  Administered 2022-01-04 (×2): 50 ug via INTRAVENOUS

## 2022-01-04 MED ORDER — OXYCODONE HCL 5 MG PO TABS
ORAL_TABLET | ORAL | Status: AC
  Filled 2022-01-04: qty 1

## 2022-01-04 MED ORDER — DEXMEDETOMIDINE HCL IN NACL 200 MCG/50ML IV SOLN
INTRAVENOUS | Status: DC | PRN
  Administered 2022-01-04: 4 ug via INTRAVENOUS

## 2022-01-04 MED ORDER — FENTANYL CITRATE (PF) 100 MCG/2ML IJ SOLN
25.0000 ug | INTRAMUSCULAR | Status: DC | PRN
  Administered 2022-01-04 (×3): 50 ug via INTRAVENOUS

## 2022-01-04 MED ORDER — CEFAZOLIN SODIUM-DEXTROSE 2-4 GM/100ML-% IV SOLN
INTRAVENOUS | Status: AC
  Filled 2022-01-04: qty 100

## 2022-01-04 MED ORDER — FAMOTIDINE 20 MG PO TABS: 20.0000 mg | ORAL_TABLET | Freq: Once | ORAL | Status: AC

## 2022-01-04 MED ORDER — FENTANYL CITRATE PF 50 MCG/ML IJ SOSY: 50.0000 ug | PREFILLED_SYRINGE | Freq: Once | INTRAMUSCULAR | Status: DC

## 2022-01-04 MED ORDER — OXYCODONE HCL 5 MG PO TABS
5.0000 mg | ORAL_TABLET | Freq: Once | ORAL | Status: AC | PRN
  Administered 2022-01-04: 5 mg via ORAL

## 2022-01-04 MED ORDER — CEFAZOLIN SODIUM-DEXTROSE 2-4 GM/100ML-% IV SOLN
2.0000 g | Freq: Once | INTRAVENOUS | Status: AC
  Administered 2022-01-04: 2 g via INTRAVENOUS

## 2022-01-04 MED ORDER — BUPIVACAINE HCL (PF) 0.5 % IJ SOLN
INTRAMUSCULAR | Status: DC | PRN
  Administered 2022-01-04: 30 mL

## 2022-01-04 MED ORDER — PROPOFOL 10 MG/ML IV BOLUS
INTRAVENOUS | Status: DC | PRN
  Administered 2022-01-04: 200 mg via INTRAVENOUS

## 2022-01-04 MED ORDER — 0.9 % SODIUM CHLORIDE (POUR BTL) OPTIME
TOPICAL | Status: DC | PRN
  Administered 2022-01-04: 800 mL

## 2022-01-04 MED ORDER — FAMOTIDINE 20 MG PO TABS
ORAL_TABLET | ORAL | Status: AC
  Administered 2022-01-04: 20 mg via ORAL
  Filled 2022-01-04: qty 1

## 2022-01-04 MED ORDER — LIDOCAINE HCL (CARDIAC) PF 100 MG/5ML IV SOSY
PREFILLED_SYRINGE | INTRAVENOUS | Status: DC | PRN
  Administered 2022-01-04: 100 mg via INTRAVENOUS

## 2022-01-04 MED ORDER — LIDOCAINE HCL (PF) 1 % IJ SOLN
INTRAMUSCULAR | Status: AC
  Filled 2022-01-04: qty 5

## 2022-01-04 MED ORDER — OXYCODONE HCL 5 MG/5ML PO SOLN: 5.0000 mg | Freq: Once | ORAL | Status: AC | PRN

## 2022-01-04 MED ORDER — BUPIVACAINE HCL (PF) 0.5 % IJ SOLN
INTRAMUSCULAR | Status: AC
  Filled 2022-01-04: qty 30

## 2022-01-04 MED ORDER — ACETAMINOPHEN 10 MG/ML IV SOLN
INTRAVENOUS | Status: DC | PRN
  Administered 2022-01-04: 1000 mg via INTRAVENOUS

## 2022-01-04 MED ORDER — OXYCODONE-ACETAMINOPHEN 5-325 MG PO TABS: 1.0000 | ORAL_TABLET | ORAL | 0 refills | Status: AC | PRN

## 2022-01-04 MED ORDER — DEXAMETHASONE SODIUM PHOSPHATE 10 MG/ML IJ SOLN
INTRAMUSCULAR | Status: DC | PRN
  Administered 2022-01-04: 4 mg via INTRAVENOUS

## 2022-01-04 MED ORDER — CHLORHEXIDINE GLUCONATE 0.12 % MT SOLN: 15.0000 mL | Freq: Once | OROMUCOSAL | Status: AC

## 2022-01-04 MED ORDER — BUPIVACAINE HCL (PF) 0.5 % IJ SOLN
INTRAMUSCULAR | Status: AC
  Filled 2022-01-04: qty 20

## 2022-01-04 MED ORDER — SODIUM CHLORIDE 0.9 % IV SOLN: INTRAVENOUS | Status: DC

## 2022-01-04 MED ORDER — ACETAMINOPHEN 10 MG/ML IV SOLN
INTRAVENOUS | Status: AC
  Filled 2022-01-04: qty 100

## 2022-01-04 MED ORDER — FENTANYL CITRATE PF 50 MCG/ML IJ SOSY
PREFILLED_SYRINGE | INTRAMUSCULAR | Status: AC
  Filled 2022-01-04: qty 1

## 2022-01-04 SURGICAL SUPPLY — 56 items
APL PRP STRL LF DISP 70% ISPRP (MISCELLANEOUS) ×1
BIT DRILL 2 QR W/DEPTH MARKS (BIT) ×1
BIT DRILL 2.7 QR (BIT) ×1
BIT DRILL 2.8 QR W/DEPTH MARKS (BIT) IMPLANT
BIT DRILL 27 CANN QC (BIT) IMPLANT
BIT DRILL 2MM QR W/DEPTH MARKS (BIT) IMPLANT
BIT DRILL SURGIBIT 2.7MM QR (BIT) IMPLANT
BNDG CMPR 5X4 CHSV STRCH STRL (GAUZE/BANDAGES/DRESSINGS)
BNDG CMPR STD VLCR NS LF 5.8X4 (GAUZE/BANDAGES/DRESSINGS)
BNDG COHESIVE 4X5 TAN STRL LF (GAUZE/BANDAGES/DRESSINGS) ×1 IMPLANT
BNDG ELASTIC 4X5.8 VLCR NS LF (GAUZE/BANDAGES/DRESSINGS) ×1 IMPLANT
BNDG ESMARK 4X12 TAN STRL LF (GAUZE/BANDAGES/DRESSINGS) ×1 IMPLANT
BNDG GAUZE DERMACEA FLUFF 4 (GAUZE/BANDAGES/DRESSINGS) ×1 IMPLANT
BNDG GZE DERMACEA 4 6PLY (GAUZE/BANDAGES/DRESSINGS)
BOOT STEPPER DURA MED (SOFTGOODS) IMPLANT
CHLORAPREP W/TINT 26 (MISCELLANEOUS) ×1 IMPLANT
DRAPE C-ARM 42X70 (DRAPES) ×1 IMPLANT
DRAPE C-ARMOR (DRAPES) ×1 IMPLANT
ELECT REM PT RETURN 9FT ADLT (ELECTROSURGICAL) ×1
ELECTRODE REM PT RTRN 9FT ADLT (ELECTROSURGICAL) ×1 IMPLANT
GAUZE SPONGE 4X4 12PLY STRL (GAUZE/BANDAGES/DRESSINGS) ×1 IMPLANT
GAUZE XEROFORM 1X8 LF (GAUZE/BANDAGES/DRESSINGS) ×1 IMPLANT
GLOVE BIO SURGEON STRL SZ7.5 (GLOVE) ×1 IMPLANT
GLOVE SURG SYN 7.0 (GLOVE) ×3 IMPLANT
GLOVE SURG SYN 7.0 PF PI (GLOVE) ×1 IMPLANT
GOWN STRL REUS W/ TWL LRG LVL3 (GOWN DISPOSABLE) ×1 IMPLANT
GOWN STRL REUS W/ TWL XL LVL3 (GOWN DISPOSABLE) ×1 IMPLANT
GOWN STRL REUS W/TWL LRG LVL3 (GOWN DISPOSABLE) ×1
GOWN STRL REUS W/TWL XL LVL3 (GOWN DISPOSABLE) ×2
GUIDEWIRE ORTHO 1.3X150 NT (WIRE) IMPLANT
KIT REPAIR ANKLE SYNDESMOSIS (Ankle) IMPLANT
KIT TURNOVER KIT A (KITS) ×1 IMPLANT
MANIFOLD NEPTUNE II (INSTRUMENTS) ×1 IMPLANT
NEEDLE HYPO 22GX1.5 SAFETY (NEEDLE) ×1 IMPLANT
NS IRRIG 500ML POUR BTL (IV SOLUTION) ×1 IMPLANT
PACK EXTREMITY ARMC (MISCELLANEOUS) ×1 IMPLANT
PAD ABD DERMACEA PRESS 5X9 (GAUZE/BANDAGES/DRESSINGS) ×1 IMPLANT
PAD PREP 24X41 OB/GYN DISP (PERSONAL CARE ITEMS) ×1 IMPLANT
PLATE FIBULA 5-HOLE RIGHT (Plate) IMPLANT
SCREW LOCK 12X2.7X HEXALOBE (Screw) IMPLANT
SCREW LOCK 12X3.5X HEXALOBE (Screw) IMPLANT
SCREW LOCKING 2.7X12MM (Screw) ×5 IMPLANT
SCREW LOCKING 3.5X10MM (Screw) IMPLANT
SCREW LOCKING 3.5X12 (Screw) ×2 IMPLANT
SCREW NONLOCK HEX 2.7X18MM (Screw) IMPLANT
SCREW NONLOCK HEX 3.5X12 (Screw) IMPLANT
SPONGE T-LAP 18X18 ~~LOC~~+RFID (SPONGE) ×1 IMPLANT
STAPLER SKIN PROX 35W (STAPLE) IMPLANT
STOCKINETTE M/LG 89821 (MISCELLANEOUS) ×1 IMPLANT
STRAP SAFETY 5IN WIDE (MISCELLANEOUS) ×1 IMPLANT
SUT MNCRL AB 3-0 PS2 27 (SUTURE) ×1 IMPLANT
SUT MNCRL AB 4-0 PS2 18 (SUTURE) ×1 IMPLANT
SUT MON AB 5-0 P3 18 (SUTURE) ×1 IMPLANT
SYR 10ML LL (SYRINGE) ×1 IMPLANT
TRAP FLUID SMOKE EVACUATOR (MISCELLANEOUS) ×1 IMPLANT
WIRE PLATE TACK (WIRE) IMPLANT

## 2022-01-04 NOTE — Interval H&P Note (Signed)
History and Physical Interval Note:  01/04/2022 9:22 AM  Terri Burnett  has presented today for surgery, with the diagnosis of ANKLE FRACTURE RIGHT FOOT.  The various methods of treatment have been discussed with the patient and family. After consideration of risks, benefits and other options for treatment, the patient has consented to  Procedure(s) with comments: OPEN REDUCTION INTERNAL FIXATION (ORIF) ANKLE FRACTURE WITH SYNDESMOSIS REDUCTION (Right) - POPLITEAL BLOCK as a surgical intervention.  The patient's history has been reviewed, patient examined, no change in status, stable for surgery.  I have reviewed the patient's chart and labs.  Questions were answered to the patient's satisfaction.     [name]

## 2022-01-04 NOTE — Anesthesia Procedure Notes (Signed)
Procedure Name: Intubation Date/Time: 01/04/2022 11:34 AM  Performed by: Hezzie Bump, CRNAPre-anesthesia Checklist: Patient identified, Patient being monitored, Timeout performed, Emergency Drugs available and Suction available Patient Re-evaluated:Patient Re-evaluated prior to induction Oxygen Delivery Method: Circle system utilized Preoxygenation: Pre-oxygenation with 100% oxygen Induction Type: IV induction Ventilation: Mask ventilation without difficulty Laryngoscope Size: 3 and McGraph Grade View: Grade I Tube type: Oral Tube size: 6.5 mm Number of attempts: 1 Airway Equipment and Method: Stylet Placement Confirmation: ETT inserted through vocal cords under direct vision, positive ETCO2 and breath sounds checked- equal and bilateral Secured at: 19 cm Tube secured with: Tape Dental Injury: Teeth and Oropharynx as per pre-operative assessment

## 2022-01-04 NOTE — Discharge Instructions (Signed)
AMBULATORY SURGERY  ?DISCHARGE INSTRUCTIONS ? ? ?The drugs that you were given will stay in your system until tomorrow so for the next 24 hours you should not: ? ?Drive an automobile ?Make any legal decisions ?Drink any alcoholic beverage ? ? ?You may resume regular meals tomorrow.  Today it is better to start with liquids and gradually work up to solid foods. ? ?You may eat anything you prefer, but it is better to start with liquids, then soup and crackers, and gradually work up to solid foods. ? ? ?Please notify your doctor immediately if you have any unusual bleeding, trouble breathing, redness and pain at the surgery site, drainage, fever, or pain not relieved by medication. ? ? ? ?Additional Instructions: ? ? ? ?Please contact your physician with any problems or Same Day Surgery at [phone], Monday through Friday 6 am to 4 pm, or [place] [facility] [place] Main number at [phone].  ?

## 2022-01-04 NOTE — Transfer of Care (Signed)
Immediate Anesthesia Transfer of Care Note  Patient: Terri Burnett  Procedure(s) Performed: OPEN REDUCTION INTERNAL FIXATION (ORIF) ANKLE FRACTURE WITH SYNDESMOSIS REDUCTION (Right: Ankle)  Patient Location: PACU  Anesthesia Type:General  Level of Consciousness: awake and oriented  Airway & Oxygen Therapy: Patient Spontanous Breathing and Patient connected to face mask oxygen  Post-op Assessment: Report given to RN and Post -op Vital signs reviewed and stable  Post vital signs: Reviewed and stable  Last Vitals:  Vitals Value Taken Time  BP 164/89 01/04/22 1311  Temp    Pulse 78 01/04/22 1311  Resp 14 01/04/22 1311  SpO2 100 % 01/04/22 1311    Last Pain:  Vitals:   01/04/22 0919  TempSrc: Oral         Complications: No notable events documented.

## 2022-01-04 NOTE — Anesthesia Procedure Notes (Signed)
Anesthesia Regional Block: Popliteal block   Pre-Anesthetic Checklist: , timeout performed,  Correct Patient, Correct Site, Correct Laterality,  Correct Procedure, Correct Position, site marked,  Risks and benefits discussed,  Surgical consent,  Pre-op evaluation,  At surgeon's request and post-op pain management  Laterality: Lower  Prep: chloraprep       Needles:  Injection technique: Single-shot  Needle Type: Echogenic Needle     Needle Length: 9cm  Needle Gauge: 21     Additional Needles:   Procedures:,,,, ultrasound used (permanent image in chart),,    Narrative:  Start time: 01/04/2022 1:32 PM End time: 01/04/2022 1:34 PM Injection made incrementally with aspirations every 5 mL.  Performed by: Personally  Anesthesiologist: Louie Boston, MD  Additional Notes: Patient's chart reviewed and they were deemed appropriate candidate for procedure, at surgeon's request. Patient educated about risks, benefits, and alternatives of the block including but not limited to: temporary or permanent nerve damage, bleeding, infection, damage to surround tissues, block failure, local anesthetic toxicity. Patient expressed understanding. A formal time-out was conducted consistent with institution rules.  Monitors were applied, and minimal sedation used. The site was prepped with skin prep and allowed to dry, and sterile gloves were used. A high frequency linear ultrasound probe with probe cover was utilized throughout. Popliteal artery pulsatile and visualized in popliteal fossa along with adjacent sciatic nerve and its branch point, which appeared anatomically normal, local anesthetic injected around them just proximal to the branch point, and echogenic block needle trajectory was monitored throughout. Aspiration performed every 13ml. Blood vessels were avoided. All injections were performed without resistance and free of blood and paresthesias. The patient tolerated the procedure  well.  Injectate: 20cc 0.5% bupivicaine

## 2022-01-04 NOTE — Anesthesia Postprocedure Evaluation (Signed)
Anesthesia Post Note  Patient: [name]  Procedure(s) Performed: OPEN REDUCTION INTERNAL FIXATION (ORIF) ANKLE FRACTURE WITH SYNDESMOSIS REDUCTION (Right: Ankle)  Patient location during evaluation: PACU Anesthesia Type: General Level of consciousness: awake and alert Pain management: pain level controlled Vital Signs Assessment: post-procedure vital signs reviewed and stable Respiratory status: spontaneous breathing, nonlabored ventilation, respiratory function stable and patient connected to nasal cannula oxygen Cardiovascular status: blood pressure returned to baseline and stable Postop Assessment: no apparent nausea or vomiting Anesthetic complications: no   No notable events documented.   Last Vitals:  Vitals:   01/04/22 1308 01/04/22 1311  BP: (!) 164/89 (!) 164/89  Pulse: 79 78  Resp: 15 14  Temp: 36.8 C   SpO2: 100% 100%    Last Pain:  Vitals:   01/04/22 0919  TempSrc: Oral                 Terri Burnett

## 2022-01-04 NOTE — Anesthesia Preprocedure Evaluation (Addendum)
Anesthesia Evaluation  Patient identified by MRN, date of birth, ID band Patient awake    Reviewed: Allergy & Precautions, NPO status , Patient's Chart, lab work & pertinent test results  History of Anesthesia Complications Negative for: history of anesthetic complications  Airway Mallampati: II  TM Distance: >3 FB Neck ROM: full    Dental  (+) Chipped, Poor Dentition   Pulmonary former smoker   Pulmonary exam normal        Cardiovascular hypertension, + CAD and + Peripheral Vascular Disease  Normal cardiovascular exam  ECHO Conclusions   - Left ventricle: The cavity size was normal. There was mild    concentric hypertrophy. Systolic function was normal. The    estimated ejection fraction was in the range of 60% to 65%. Wall    motion was normal; there were no regional wall motion    abnormalities. There was an increased relative contribution of    atrial contraction to ventricular filling. Doppler parameters are    consistent with abnormal left ventricular relaxation (grade 1    diastolic dysfunction).  - Aortic valve: Moderate diffuse calcification involving the left    coronary cusp. Valve area (VTI): 3.11 cm^2. Valve area (Vmax):    2.92 cm^2. Valve area (Vmean): 3.02 cm^2.  - Atrial septum: No defect or patent foramen ovale was identified.     Neuro/Psych Seizures -,  CVA  negative psych ROS   GI/Hepatic negative GI ROS, Neg liver ROS,,,  Endo/Other  diabetes    Renal/GU negative Renal ROS  negative genitourinary   Musculoskeletal   Abdominal   Peds  Hematology negative hematology ROS (+)   Anesthesia Other Findings Past Medical History: No date: Diabetes mellitus without complication ([facility]) No date: Hypertension No date: Stroke [facility])  Past Surgical History: 02/17/2021: LOWER EXTREMITY ANGIOGRAPHY; Right     Comment:  Procedure: LOWER EXTREMITY ANGIOGRAPHY;  Surgeon: Annice Needy, MD;   Location: [facility] INVASIVE CV LAB;  Service:               Cardiovascular;  Laterality: Right; No date: TUBAL LIGATION     Reproductive/Obstetrics negative OB ROS                             Anesthesia Physical Anesthesia Plan  ASA: 3  Anesthesia Plan: General   Post-op Pain Management: Regional block*   Induction: Intravenous  PONV Risk Score and Plan: 3 and Propofol infusion and TIVA  Airway Management Planned: Natural Airway and Nasal Cannula  Additional Equipment:   Intra-op Plan:   Post-operative Plan: Extubation in OR  Informed Consent: I have reviewed the patients History and Physical, chart, labs and discussed the procedure including the risks, benefits and alternatives for the proposed anesthesia with the patient or authorized representative who has indicated his/her understanding and acceptance.     Dental Advisory Given  Plan Discussed with: Anesthesiologist, CRNA and Surgeon  Anesthesia Plan Comments: (Patient consented for risks of anesthesia including but not limited to:  - adverse reactions to medications - risk of airway placement if required - damage to eyes, teeth, lips or other oral mucosa - nerve damage due to positioning  - sore throat or hoarseness - Damage to heart, brain, nerves, lungs, other parts of body or loss of life  Patient voiced understanding.)        Anesthesia Quick Evaluation

## 2022-01-05 ENCOUNTER — Encounter: Payer: Self-pay | Admitting: Podiatry

## 2022-01-11 ENCOUNTER — Ambulatory Visit (INDEPENDENT_AMBULATORY_CARE_PROVIDER_SITE_OTHER): Payer: Medicare Other | Admitting: Podiatry

## 2022-01-11 ENCOUNTER — Ambulatory Visit (INDEPENDENT_AMBULATORY_CARE_PROVIDER_SITE_OTHER): Payer: Medicare Other

## 2022-01-11 DIAGNOSIS — S82891A Other fracture of right lower leg, initial encounter for closed fracture: Secondary | ICD-10-CM

## 2022-01-11 DIAGNOSIS — S93431A Sprain of tibiofibular ligament of right ankle, initial encounter: Secondary | ICD-10-CM

## 2022-01-11 DIAGNOSIS — Z9889 Other specified postprocedural states: Secondary | ICD-10-CM

## 2022-01-11 MED ORDER — OXYCODONE-ACETAMINOPHEN 5-325 MG PO TABS: 1.0000 | ORAL_TABLET | ORAL | 0 refills | Status: AC | PRN

## 2022-01-11 NOTE — Op Note (Signed)
Surgeon: Surgeon(s): [name], DPM  Assistants: None Pre-operative diagnosis: ANKLE FRACTURE RIGHT FOOT  Post-operative diagnosis: same Procedure: Procedure(s) (LRB): OPEN REDUCTION INTERNAL FIXATION (ORIF) ANKLE FRACTURE WITH SYNDESMOSIS REDUCTION (Right)  Pathology: * No specimens in log *  Pertinent Intra-op findings: Syndesmotic instability noted Anesthesia: Choice  Hemostasis:  Total Tourniquet Time Documented: Thigh (Right) - 65 minutes Total: Thigh (Right) - 65 minutes  EBL: 0 mL  Materials: Acumed plates and screw and Acumed tightropes, 3-0 monocryl, skin staples Injectables: None Complications: None  Indications for surgery: A 66 y.o. female presents with Right ankle fracture painful unstable. Patient has failed all conservative therapy including but not limited to nonweightbearing and immobilization. She wishes to have surgical correction of the foot/deformity. It was determined that patient would benefit from Right open reduction internal fixation with syndesmotic reduction using plate screws and type. Informed surgical risk consent was reviewed and read aloud to the patient.  I reviewed the films.  I have discussed my findings with the patient in great detail.  I have discussed all risks including but not limited to infection, stiffness, scarring, limp, disability, deformity, damage to blood vessels and nerves, numbness, poor healing, need for braces, arthritis, chronic pain, amputation, death.  All benefits and realistic expectations discussed in great detail.  I have made no promises as to the outcome.  I have provided realistic expectations.  I have offered the patient a 2nd opinion, which they have declined and assured me they preferred to proceed despite the risks   Procedure in detail: The patient was both verbally and visually identified by myself, the nursing staff, and anesthesia staff in the preoperative holding area. They were then transferred to the operating  room and placed on the operative table in supine position. An Esmarch was used to exsanguinate the extremity and the tourniquet was inflated.   An incision was made over the lateral ankle over the lateral malleolus, and carried down through the fascia and down to the bone. The fracture was exposed after placing a self-retaining retractor. The fracture edges were curetted and cleaned with the knife. The hematoma at the fracture site was removed with a curette. At this point, the fracture was reduced with a reduction clamp and an intergragmentary lag screw was placed, fixing the fracture in place. Then a plate was placed in the fracture, which was a tubular plate and the screws were placed distally and proximally, placing 5 screws distal and 4 screws proximal to the fracture. This gave excellent fixation of the fracture. At this time, C-arm pictures were obtained in AP, mortise and lateral, and it showed excellent reduction of the fracture and of the mortise, and good length of the screws. However, there was still syndesmotic instability noted with stress test. Reduction clamp was used to reduce the syndesmosis.  a guide pin were then placed in the fibula parallel to the plane of the ankle joint and approximately 1.5  cm proximal to the joint line.The syndesmosis was reduced to a near anatomic position on AP lateral and mortise views using a clamp which was placed on  the fibula and tibia.  Once reduced, the medial clear space and the syndesmosis appeared anatomic.  The drill was used to produce a quadrant cortical tunnel and then the Acumend ankle type group was deployed, tensioned appropriately and the reduction clamp was removed.Cathlean Sauer showed excellent position of the screws for the syndesmosis repair. the syndesmosis and medial clear space remained anatomically reduced.  At this point of the procedure, the wound was irrigated copiously with normal saline, the subcu tissue was closed with 2-0 monocryl, 3-0  monocyl , and the skin was closed with skin staples interrupted stitches. The wound was dressed with Xeroform, 4x4s, ABD, and a compressive dressing, and the patient's ankle was placed in a CAM boot. The postop course was to do physical therapy for an open reduction, internal fixation of the ankle protocol.    At the conclusion of the procedure the patient was awoken from anesthesia and found to have tolerated the procedure well any complications. There were transferred to PACU with vital signs stable and vascular status intact.  Nicholes Rough, DPM

## 2022-01-12 NOTE — Progress Notes (Signed)
Subjective:  Patient ID: Terri Burnett, female    DOB: 12-06-1955,  MRN: [id number]  No chief complaint on file.   DOS: 01/04/2022 Procedure: Right ORIF of ankle fracture with syndesmotic repair  66 y.o. female returns for post-op check.  Patient states she is doing okay.  She is having pain.  She has been nonweightbearing to the right lower extremity in a wheelchair.  Bandages clean dry and intact.  Denies any other acute complaints.  Review of Systems: Negative except as noted in the HPI. Denies N/V/F/Ch.  Past Medical History:  Diagnosis Date   Diabetes mellitus without complication (HCC)    Hypertension    Stroke [facility])     Current Outpatient Medications:    oxyCODONE-acetaminophen (PERCOCET) 5-325 MG tablet, Take 1 tablet by mouth every 4 (four) hours as needed for severe pain., Disp: 30 tablet, Rfl: 0   amLODipine (NORVASC) 10 MG tablet, Take 10 mg by mouth daily., Disp: , Rfl:    atorvastatin (LIPITOR) 40 MG tablet, Take 40 mg by mouth at bedtime. , Disp: , Rfl:    clopidogrel (PLAVIX) 75 MG tablet, Take 75 mg by mouth daily., Disp: , Rfl:    ibuprofen (ADVIL) 800 MG tablet, Take 1 tablet (800 mg total) by mouth every 6 (six) hours as needed., Disp: 60 tablet, Rfl: 1   lacosamide (VIMPAT) 50 MG TABS tablet, Take 1 tablet (50 mg total) by mouth 2 (two) times daily., Disp: 60 tablet, Rfl: 1   losartan (COZAAR) 100 MG tablet, Take 100 mg by mouth daily., Disp: , Rfl:    metFORMIN (GLUCOPHAGE-XR) 500 MG 24 hr tablet, Take 500 mg by mouth daily with breakfast., Disp: , Rfl:    oxyCODONE (ROXICODONE) 5 MG immediate release tablet, Take 1 tablet (5 mg total) by mouth every 6 (six) hours as needed for severe pain., Disp: 20 tablet, Rfl: 0   oxyCODONE-acetaminophen (PERCOCET) 5-325 MG tablet, Take 1 tablet by mouth every 4 (four) hours as needed for severe pain., Disp: 30 tablet, Rfl: 0  Social History   Tobacco Use  Smoking Status Former   Packs/day: 1.00   Years: 34.00    Total pack years: 34.00   Types: Cigarettes   Quit date: 2009   Years since quitting: 14.8  Smokeless Tobacco Never    No Known Allergies Objective:  There were no vitals filed for this visit. There is no height or weight on file to calculate BMI. Constitutional Well developed. Well nourished.  Vascular Foot warm and well perfused. Capillary refill normal to all digits.   Neurologic Normal speech. Oriented to person, place, and time. Epicritic sensation to light touch grossly present bilaterally.  Dermatologic Skin healing well without signs of infection. Skin edges well coapted without signs of infection.  Orthopedic: Tenderness to palpation noted about the surgical site.   Radiographs: 3 views of skeletally mature the right foot: Hardware is intact no signs of backing out or loosening noted.  Reduction of syndesmosis noted.  Hardware is intact. Assessment:   1. Closed fracture of right ankle, initial encounter   2. Syndesmotic disruption of right ankle, initial encounter   3. Status post foot surgery    Plan:  Patient was evaluated and treated and all questions answered.  S/p foot surgery right -Progressing as expected post-operatively. -XR: See above -WB Status: Nonweightbearing to the right lower extremity in wheelchair -Sutures: Intact.  No clinical signs of dehiscence noted no complication noted. -Medications: Percocet -Foot redressed.  No follow-ups on  file.

## 2022-01-25 ENCOUNTER — Ambulatory Visit (INDEPENDENT_AMBULATORY_CARE_PROVIDER_SITE_OTHER): Payer: Medicare Other | Admitting: Podiatry

## 2022-01-25 DIAGNOSIS — S82891A Other fracture of right lower leg, initial encounter for closed fracture: Secondary | ICD-10-CM

## 2022-01-25 DIAGNOSIS — Z9889 Other specified postprocedural states: Secondary | ICD-10-CM

## 2022-01-25 DIAGNOSIS — S93431A Sprain of tibiofibular ligament of right ankle, initial encounter: Secondary | ICD-10-CM

## 2022-01-25 MED ORDER — OXYCODONE-ACETAMINOPHEN 5-325 MG PO TABS: 1.0000 | ORAL_TABLET | ORAL | 0 refills | Status: AC | PRN

## 2022-01-25 NOTE — Progress Notes (Signed)
Subjective:  Patient ID: Terri Burnett, female    DOB: May 18, 1955,  MRN: [id number]  Chief Complaint  Patient presents with   Routine Post Op    POV #2 DOS 01/04/2022 RT ANKLE OPIF W/ SYDESMOSIS REDUCTION    DOS: 01/04/2022 Procedure: Right ORIF of ankle fracture with syndesmotic repair  66 y.o. female returns for post-op check.  Patient states she is doing okay.  She is having pain.  She has been nonweightbearing to the right lower extremity in a wheelchair.  Bandages clean dry and intact.  Denies any other acute complaints.  Review of Systems: Negative except as noted in the HPI. Denies N/V/F/Ch.  Past Medical History:  Diagnosis Date   Diabetes mellitus without complication (HCC)    Hypertension    Stroke [facility])     Current Outpatient Medications:    amLODipine (NORVASC) 10 MG tablet, Take 10 mg by mouth daily., Disp: , Rfl:    atorvastatin (LIPITOR) 40 MG tablet, Take 40 mg by mouth at bedtime. , Disp: , Rfl:    clopidogrel (PLAVIX) 75 MG tablet, Take 75 mg by mouth daily., Disp: , Rfl:    ibuprofen (ADVIL) 800 MG tablet, Take 1 tablet (800 mg total) by mouth every 6 (six) hours as needed., Disp: 60 tablet, Rfl: 1   lacosamide (VIMPAT) 50 MG TABS tablet, Take 1 tablet (50 mg total) by mouth 2 (two) times daily., Disp: 60 tablet, Rfl: 1   losartan (COZAAR) 100 MG tablet, Take 100 mg by mouth daily., Disp: , Rfl:    metFORMIN (GLUCOPHAGE-XR) 500 MG 24 hr tablet, Take 500 mg by mouth daily with breakfast., Disp: , Rfl:    oxyCODONE (ROXICODONE) 5 MG immediate release tablet, Take 1 tablet (5 mg total) by mouth every 6 (six) hours as needed for severe pain., Disp: 20 tablet, Rfl: 0   oxyCODONE-acetaminophen (PERCOCET) 5-325 MG tablet, Take 1 tablet by mouth every 4 (four) hours as needed for severe pain., Disp: 30 tablet, Rfl: 0   oxyCODONE-acetaminophen (PERCOCET) 5-325 MG tablet, Take 1 tablet by mouth every 4 (four) hours as needed for severe pain., Disp: 30 tablet, Rfl:  0  Social History   Tobacco Use  Smoking Status Former   Packs/day: 1.00   Years: 34.00   Total pack years: 34.00   Types: Cigarettes   Quit date: 2009   Years since quitting: 14.9  Smokeless Tobacco Never    No Known Allergies Objective:  There were no vitals filed for this visit. There is no height or weight on file to calculate BMI. Constitutional Well developed. Well nourished.  Vascular Foot warm and well perfused. Capillary refill normal to all digits.   Neurologic Normal speech. Oriented to person, place, and time. Epicritic sensation to light touch grossly present bilaterally.  Dermatologic Skin is healing.  Sutures are not quite ready to come out.  Good range of motion noted at the ankle joint  Orthopedic: Tenderness to palpation noted about the surgical site.   Radiographs: 3 views of skeletally mature the right foot: Hardware is intact no signs of backing out or loosening noted.  Reduction of syndesmosis noted.  Hardware is intact. Assessment:   No diagnosis found.  Plan:  Patient was evaluated and treated and all questions answered.  S/p foot surgery right -Progressing as expected post-operatively. -XR: See above -WB Status: Nonweightbearing to the right lower extremity in wheelchair -Sutures: Intact.  No clinical signs of dehiscence noted no complication noted. -Medications: Percocet -Foot redressed.  No follow-ups on file.

## 2022-02-10 ENCOUNTER — Ambulatory Visit (INDEPENDENT_AMBULATORY_CARE_PROVIDER_SITE_OTHER): Payer: Medicare Other

## 2022-02-10 ENCOUNTER — Ambulatory Visit (INDEPENDENT_AMBULATORY_CARE_PROVIDER_SITE_OTHER): Payer: Medicare Other | Admitting: Podiatry

## 2022-02-10 DIAGNOSIS — Z9889 Other specified postprocedural states: Secondary | ICD-10-CM

## 2022-02-10 DIAGNOSIS — S82891A Other fracture of right lower leg, initial encounter for closed fracture: Secondary | ICD-10-CM

## 2022-02-10 DIAGNOSIS — T8130XA Disruption of wound, unspecified, initial encounter: Secondary | ICD-10-CM

## 2022-02-10 MED ORDER — DOXYCYCLINE HYCLATE 100 MG PO TABS: 100.0000 mg | ORAL_TABLET | Freq: Two times a day (BID) | ORAL | 0 refills | Status: AC

## 2022-02-16 NOTE — Progress Notes (Signed)
Subjective:  Patient ID: Terri Burnett, female    DOB: [birth year],  MRN: [id number]  No chief complaint on file.   DOS: 01/04/2022 Procedure: Right ORIF of ankle fracture with syndesmotic repair  66 y.o. female returns for post-op check.  Patient states she is doing okay.  She is having pain.  She has been nonweightbearing to the right lower extremity in a wheelchair.  Bandages clean dry and intact.  Denies any other acute complaints.  Review of Systems: Negative except as noted in the HPI. Denies N/V/F/Ch.  Past Medical History:  Diagnosis Date   Diabetes mellitus without complication (HCC)    Hypertension    Stroke [facility])     Current Outpatient Medications:    doxycycline (VIBRA-TABS) 100 MG tablet, Take 1 tablet (100 mg total) by mouth 2 (two) times daily., Disp: 60 tablet, Rfl: 0   amLODipine (NORVASC) 10 MG tablet, Take 10 mg by mouth daily., Disp: , Rfl:    atorvastatin (LIPITOR) 40 MG tablet, Take 40 mg by mouth at bedtime. , Disp: , Rfl:    clopidogrel (PLAVIX) 75 MG tablet, Take 75 mg by mouth daily., Disp: , Rfl:    ibuprofen (ADVIL) 800 MG tablet, Take 1 tablet (800 mg total) by mouth every 6 (six) hours as needed., Disp: 60 tablet, Rfl: 1   lacosamide (VIMPAT) 50 MG TABS tablet, Take 1 tablet (50 mg total) by mouth 2 (two) times daily., Disp: 60 tablet, Rfl: 1   losartan (COZAAR) 100 MG tablet, Take 100 mg by mouth daily., Disp: , Rfl:    metFORMIN (GLUCOPHAGE-XR) 500 MG 24 hr tablet, Take 500 mg by mouth daily with breakfast., Disp: , Rfl:    oxyCODONE (ROXICODONE) 5 MG immediate release tablet, Take 1 tablet (5 mg total) by mouth every 6 (six) hours as needed for severe pain., Disp: 20 tablet, Rfl: 0   oxyCODONE-acetaminophen (PERCOCET) 5-325 MG tablet, Take 1 tablet by mouth every 4 (four) hours as needed for severe pain., Disp: 30 tablet, Rfl: 0   oxyCODONE-acetaminophen (PERCOCET) 5-325 MG tablet, Take 1 tablet by mouth every 4 (four) hours as needed for  severe pain., Disp: 30 tablet, Rfl: 0   oxyCODONE-acetaminophen (PERCOCET) 5-325 MG tablet, Take 1 tablet by mouth every 4 (four) hours as needed for severe pain., Disp: 30 tablet, Rfl: 0  Social History   Tobacco Use  Smoking Status Former   Packs/day: 1.00   Years: 34.00   Total pack years: 34.00   Types: Cigarettes   Quit date: 2009   Years since quitting: 14.9  Smokeless Tobacco Never    No Known Allergies Objective:  There were no vitals filed for this visit. There is no height or weight on file to calculate BMI. Constitutional Well developed. Well nourished.  Vascular Foot warm and well perfused. Capillary refill normal to all digits.   Neurologic Normal speech. Oriented to person, place, and time. Epicritic sensation to light touch grossly present bilaterally.  Dermatologic Skin is healing.  Wound dehiscence noted superficially 2 cm x 2.5 cm x 0.2 cm.  Mild erythema but no probing down to bone no purulent drainage noted.  Orthopedic: Tenderness to palpation noted about the surgical site.   Radiographs: 3 views of skeletally mature the right foot: Hardware is intact no signs of backing out or loosening noted.  Reduction of syndesmosis noted.  Hardware is intact. Assessment:   1. Closed fracture of right ankle, initial encounter     Plan:  Patient was evaluated  and treated and all questions answered.  S/p foot surgery right -Progressing as expected post-operatively. -XR: See above -WB Status: Nonweightbearing to the right lower extremity in wheelchair -Sutures: Superficial dehiscence noted measuring 2 cm x 2.5 cm x 0.2 cm. -Medications: None -I will place her on doxycycline indefinitely until the wound has healed. -I will also refer to the wound care center for management of the wound -Betadine wet-to-dry dressing  No follow-ups on file.

## 2022-02-17 ENCOUNTER — Encounter: Payer: Medicare Other | Admitting: Podiatry

## 2022-02-24 ENCOUNTER — Ambulatory Visit (INDEPENDENT_AMBULATORY_CARE_PROVIDER_SITE_OTHER): Payer: Medicare Other | Admitting: Podiatry

## 2022-02-24 DIAGNOSIS — S82891A Other fracture of right lower leg, initial encounter for closed fracture: Secondary | ICD-10-CM

## 2022-02-24 DIAGNOSIS — T8130XA Disruption of wound, unspecified, initial encounter: Secondary | ICD-10-CM

## 2022-02-24 MED ORDER — OXYCODONE-ACETAMINOPHEN 5-325 MG PO TABS: 1.0000 | ORAL_TABLET | ORAL | 0 refills | Status: AC | PRN

## 2022-02-24 NOTE — Progress Notes (Signed)
Subjective:  Patient ID: Terri Burnett, female    DOB: 1955-06-18,  MRN: [id number]  Chief Complaint  Patient presents with   Routine Post Op    DOS: 01/04/2022 Procedure: Right ORIF of ankle fracture with syndesmotic repair  67 y.o. female returns for post-op check.  Patient states she is doing okay.  She is having pain.  She has been nonweightbearing to the right lower extremity in a wheelchair.  Bandages clean dry and intact.  Denies any other acute complaints.  Review of Systems: Negative except as noted in the HPI. Denies N/V/F/Ch.  Past Medical History:  Diagnosis Date   Diabetes mellitus without complication ([place])    Hypertension    Stroke [facility])     Current Outpatient Medications:    oxyCODONE-acetaminophen (PERCOCET) 5-325 MG tablet, Take 1 tablet by mouth every 4 (four) hours as needed for severe pain., Disp: 30 tablet, Rfl: 0   amLODipine (NORVASC) 10 MG tablet, Take 10 mg by mouth daily., Disp: , Rfl:    atorvastatin (LIPITOR) 40 MG tablet, Take 40 mg by mouth at bedtime. , Disp: , Rfl:    clopidogrel (PLAVIX) 75 MG tablet, Take 75 mg by mouth daily., Disp: , Rfl:    doxycycline (VIBRA-TABS) 100 MG tablet, Take 1 tablet (100 mg total) by mouth 2 (two) times daily., Disp: 60 tablet, Rfl: 0   ibuprofen (ADVIL) 800 MG tablet, Take 1 tablet (800 mg total) by mouth every 6 (six) hours as needed., Disp: 60 tablet, Rfl: 1   lacosamide (VIMPAT) 50 MG TABS tablet, Take 1 tablet (50 mg total) by mouth 2 (two) times daily., Disp: 60 tablet, Rfl: 1   losartan (COZAAR) 100 MG tablet, Take 100 mg by mouth daily., Disp: , Rfl:    metFORMIN (GLUCOPHAGE-XR) 500 MG 24 hr tablet, Take 500 mg by mouth daily with breakfast., Disp: , Rfl:    oxyCODONE (ROXICODONE) 5 MG immediate release tablet, Take 1 tablet (5 mg total) by mouth every 6 (six) hours as needed for severe pain., Disp: 20 tablet, Rfl: 0   oxyCODONE-acetaminophen (PERCOCET) 5-325 MG tablet, Take 1 tablet by mouth every 4  (four) hours as needed for severe pain., Disp: 30 tablet, Rfl: 0   oxyCODONE-acetaminophen (PERCOCET) 5-325 MG tablet, Take 1 tablet by mouth every 4 (four) hours as needed for severe pain., Disp: 30 tablet, Rfl: 0   oxyCODONE-acetaminophen (PERCOCET) 5-325 MG tablet, Take 1 tablet by mouth every 4 (four) hours as needed for severe pain., Disp: 30 tablet, Rfl: 0  Social History   Tobacco Use  Smoking Status Former   Packs/day: 1.00   Years: 34.00   Total pack years: 34.00   Types: Cigarettes   Quit date: 2009   Years since quitting: 15.0  Smokeless Tobacco Never    No Known Allergies Objective:  There were no vitals filed for this visit. There is no height or weight on file to calculate BMI. Constitutional Well developed. Well nourished.  Vascular Foot warm and well perfused. Capillary refill normal to all digits.   Neurologic Normal speech. Oriented to person, place, and time. Epicritic sensation to light touch grossly present bilaterally.  Dermatologic Skin is healing.  Wound dehiscence noted superficially 2 cm x 2.5 cm x 0.2 cm.  No erythema noted.  No probing down to bone noted no hardware exposure noted.  Orthopedic: Tenderness to palpation noted about the surgical site.   Radiographs: 3 views of skeletally mature the right foot: Hardware is intact no signs  of backing out or loosening noted.  Reduction of syndesmosis noted.  Hardware is intact. Assessment:   No diagnosis found.   Plan:  Patient was evaluated and treated and all questions answered.  S/p foot surgery right -Progressing as expected post-operatively. -XR: See above -WB Status: Nonweightbearing to the right lower extremity in wheelchair -Sutures: Superficial dehiscence noted measuring 2 cm x 2.5 cm x 0.2 cm. -Medications: None -Continue doxycycline until improvement -Patient scheduled to follow-up with the wound care center on 15 January. -Betadine wet-to-dry dressing -  No follow-ups on file.

## 2022-03-07 ENCOUNTER — Encounter: Payer: 59 | Attending: Physician Assistant | Admitting: Physician Assistant

## 2022-03-07 DIAGNOSIS — W19XXXA Unspecified fall, initial encounter: Secondary | ICD-10-CM | POA: Insufficient documentation

## 2022-03-07 DIAGNOSIS — L97312 Non-pressure chronic ulcer of right ankle with fat layer exposed: Secondary | ICD-10-CM | POA: Insufficient documentation

## 2022-03-07 DIAGNOSIS — S82891A Other fracture of right lower leg, initial encounter for closed fracture: Secondary | ICD-10-CM | POA: Insufficient documentation

## 2022-03-07 DIAGNOSIS — I69351 Hemiplegia and hemiparesis following cerebral infarction affecting right dominant side: Secondary | ICD-10-CM | POA: Diagnosis not present

## 2022-03-07 DIAGNOSIS — I10 Essential (primary) hypertension: Secondary | ICD-10-CM | POA: Insufficient documentation

## 2022-03-07 DIAGNOSIS — Z87891 Personal history of nicotine dependence: Secondary | ICD-10-CM | POA: Diagnosis not present

## 2022-03-07 DIAGNOSIS — E11622 Type 2 diabetes mellitus with other skin ulcer: Secondary | ICD-10-CM | POA: Diagnosis present

## 2022-03-09 NOTE — Progress Notes (Signed)
Terri Burnett, Terri Burnett ([id number]) [id number] Page 1 of 5 Visit Report for 03/07/2022 Abuse Risk Screen Details Patient Name: Date of Service: Terri Burnett, Terri Burnett. 03/07/2022 8:45 A Burnett Medical Record Number: [id number] Patient Account Number: [id number] Date of Birth/Sex: Treating RN: [birth year] (68 y.o. Orvan Falconer Primary Care Mashell Sieben: Beverlyn Roux Other Clinician: Referring Kaesha Kirsch: Treating Rosaline Ezekiel/Extender: Danna Hefty in Treatment: 0 Abuse Risk Screen Items Answer ABUSE RISK SCREEN: Has anyone close to you tried to hurt or harm you recentlyo No Do you feel uncomfortable with anyone in your familyo No Has anyone forced you do things that you didnt want to doo No Electronic Signature(s) Signed: 03/09/2022 4:29:22 PM By: Carlene Coria RN Entered By: Carlene Coria on 03/07/2022 08:53:03 -------------------------------------------------------------------------------- Activities of Daily Living Details Patient Name: Date of Service: Terri Burnett, Terri Burnett 03/07/2022 8:45 A Burnett Medical Record Number: [id number] Patient Account Number: [id number] Date of Birth/Sex: Treating RN: 1955-11-29 (67 y.o. Orvan Falconer Primary Care Zarina Pe: Beverlyn Roux Other Clinician: Referring Chaniya Genter: Treating Ashelyn Mccravy/Extender: Danna Hefty in Treatment: 0 Activities of Daily Living Items Answer Activities of Daily Living (Please select one for each item) Drive Automobile Not Able T Medications ake Completely Able Use T elephone Completely Able Care for Appearance Need Assistance Use T oilet Need Assistance Bath / Shower Need Assistance Dress Self Need Assistance Feed Self Completely Able Walk Not Able Get In / Out Bed Need Assistance Housework Not Terri Burnett, Terri Burnett ([id number]) [id number] Page 2 of 5 Prepare Meals Not Able Handle Money Completely Able Shop for  Self Not Able Electronic Signature(s) Signed: 03/09/2022 4:29:22 PM By: Carlene Coria RN Entered By: Carlene Coria on 03/07/2022 08:54:11 -------------------------------------------------------------------------------- Education Screening Details Patient Name: Date of Service: Terri Burnett. 03/07/2022 8:45 A Burnett Medical Record Number: [id number] Patient Account Number: [id number] Date of Birth/Sex: Treating RN: 08-31-1955 (67 y.o. Orvan Falconer Primary Care Kenneshia Rehm: Beverlyn Roux Other Clinician: Referring Rayshawn Maney: Treating Quinton Voth/Extender: Danna Hefty in Treatment: 0 Primary Learner Assessed: Patient Learning Preferences/Education Level/Primary Language Learning Preference: Explanation Highest Education Level: High School Preferred Language: English Cognitive Barrier Language Barrier: No Translator Needed: No Memory Deficit: No Emotional Barrier: No Cultural/Religious Beliefs Affecting Medical Care: No Physical Barrier Impaired Vision: No Impaired Hearing: No Decreased Hand dexterity: No Knowledge/Comprehension Knowledge Level: Medium Comprehension Level: Medium Ability to understand written instructions: Medium Ability to understand verbal instructions: Medium Motivation Anxiety Level: Anxious Cooperation: Cooperative Education Importance: Acknowledges Need Interest in Health Problems: Asks Questions Perception: Coherent Willingness to Engage in Self-Management High Activities: Readiness to Engage in Self-Management High Activities: Electronic Signature(s) Signed: 03/09/2022 4:29:22 PM By: Carlene Coria RN Entered By: Carlene Coria on 03/07/2022 08:54:47 Terri Burnett, Terri Burnett ([id number]) [id number] Page 3 of 5 -------------------------------------------------------------------------------- Fall Risk Assessment Details Patient Name: Date of Service: Terri Burnett, Terri Burnett. 03/07/2022 8:45 A  Burnett Medical Record Number: [id number] Patient Account Number: [id number] Date of Birth/Sex: Treating RN: [birth year] (67 y.o. Orvan Falconer Primary Care Jair Lindblad: Beverlyn Roux Other Clinician: Referring Louanna Vanliew: Treating Sincerity Cedar/Extender: Danna Hefty in Treatment: 0 Fall Risk Assessment Items Have you had 2 or more falls in the last 12 monthso 0 Yes Have you had any fall that resulted in injury in the last 12 monthso 0 No FALLS RISK SCREEN History of falling - immediate or within 3 months 25 Yes Secondary diagnosis (Do you have 2 or more medical diagnoseso) 0 No Ambulatory aid None/bed rest/wheelchair/nurse 0 No Crutches/cane/walker 15 Yes  Furniture 0 No Intravenous therapy Access/Saline/Heparin Lock 0 No Gait/Transferring Normal/ bed rest/ wheelchair 0 No Weak (short steps with or without shuffle, stooped but able to lift head while walking, may seek 10 Yes support from furniture) Impaired (short steps with shuffle, may have difficulty arising from chair, head down, impaired 0 No balance) Mental Status Oriented to own ability 0 Yes Electronic Signature(s) Signed: 03/09/2022 4:29:22 PM By: Carlene Coria RN Entered By: Carlene Coria on 03/07/2022 08:55:19 -------------------------------------------------------------------------------- Foot Assessment Details Patient Name: Date of Service: Terri Burnett. 03/07/2022 8:45 A Burnett Medical Record Number: [id number] Patient Account Number: [id number] Date of Birth/Sex: Treating RN: 1955-04-07 (67 y.o. Orvan Falconer Primary Care Kyaire Gruenewald: Beverlyn Roux Other Clinician: Referring Rhaelyn Giron: Treating Keiffer Piper/Extender: Danna Hefty in Treatment: 0 Foot Assessment Items [x]  Unable to perform due to altered mental status Site Locations [place], [place] ([id number]) [id number] Page 4 of 5 + = Sensation present, - = Sensation absent, C = Callus, U =  Ulcer R = Redness, Burnett = Warmth, Burnett = Maceration, PU = Pre-ulcerative lesion F = Fissure, S = Swelling, D = Dryness Assessment Right: Left: Other Deformity: No No Prior Foot Ulcer: No No Prior Amputation: No No Charcot Joint: No No Ambulatory Status: Non-ambulatory Assistance Device: Wheelchair Gait: Administrator, arts) Signed: 03/09/2022 4:29:22 PM By: Carlene Coria RN Entered By: Carlene Coria on 03/07/2022 09:00:24 -------------------------------------------------------------------------------- Nutrition Risk Screening Details Patient Name: Date of Service: Terri Burnett, Terri Burnett 03/07/2022 8:45 A Burnett Medical Record Number: [id number] Patient Account Number: [id number] Date of Birth/Sex: Treating RN: 07/12/1955 (67 y.o. Orvan Falconer Primary Care Aisea Bouldin: Beverlyn Roux Other Clinician: Referring Charnelle Bergeman: Treating Mana Haberl/Extender: Danna Hefty in Treatment: 0 Height (in): 67 Weight (lbs): 205 Body Mass Index (BMI): 32.1 Nutrition Risk Screening Items Score Screening NUTRITION RISK SCREEN: I have an illness or condition that made me change the kind and/or amount of food I eat 0 No I eat fewer than two meals per day 0 No I eat few fruits and vegetables, or milk products 0 No I have three or more drinks of beer, liquor or wine almost every day 0 No I have tooth or mouth problems that make it hard for me to eat 0 No Terri Burnett, Terri Burnett ([id number]) [id number] Page 5 of 5 I don't always have enough money to buy the food I need 0 No I eat alone most of the time 0 No I take three or more different prescribed or over-the-counter drugs a day 1 Yes Without wanting to, I have lost or gained 10 pounds in the last six months 0 No I am not always physically able to shop, cook and/or feed myself 2 Yes Nutrition Protocols Good Risk Protocol Moderate Risk Protocol 0 Provide education on nutrition High Risk  Proctocol Risk Level: Moderate Risk Score: 3 Electronic Signature(s) Signed: 03/09/2022 4:29:22 PM By: Carlene Coria RN Entered By: Carlene Coria on 03/07/2022 08:55:43

## 2022-03-09 NOTE — Progress Notes (Signed)
[name], [name] ([id number]) [id number] Page 1 of 9 Visit Report for 03/07/2022 Chief Complaint Document Details Patient Name: Date of Service: Terri Burnett, Terri Burnett. 03/07/2022 8:45 A Burnett Medical Record Number: [id number] Patient Account Number: [id number] Date of Birth/Sex: Treating RN: [birth year] (67 y.o. Terri Burnett Primary Care Provider: Beverlyn Burnett Other Clinician: Referring Provider: Treating Provider/Extender: Terri Burnett in Treatment: 0 Information Obtained from: Patient Chief Complaint Right lateral ankle ulcer Electronic Signature(s) Signed: 03/07/2022 9:31:08 AM By: Terri Keeler PA-C Entered By: Terri Burnett on 03/07/2022 09:31:08 -------------------------------------------------------------------------------- Debridement Details Patient Name: Date of Service: Terri Burnett. 03/07/2022 8:45 A Burnett Medical Record Number: [id number] Patient Account Number: [id number] Date of Birth/Sex: Treating RN: [birth year] (67 y.o. Terri Burnett Primary Care Provider: Beverlyn Burnett Other Clinician: Referring Provider: Treating Provider/Extender: Terri Burnett in Treatment: 0 Debridement Performed for Assessment: Wound #1 Right,Lateral Ankle Performed By: Physician Terri Sams., PA-C Debridement Type: Debridement Level of Consciousness (Pre-procedure): Awake and Alert Pre-procedure Verification/Time Out Yes - 09:35 Taken: Start Time: 09:35 T Area Debrided (L x W): otal 1.2 (cm) x 1 (cm) = 1.2 (cm) Tissue and other material debrided: Viable, Non-Viable, Slough, Subcutaneous, Skin: Dermis , Skin: Epidermis, Slough Level: Skin/Subcutaneous Tissue Debridement Description: Excisional Instrument: Curette Bleeding: Moderate Hemostasis Achieved: Silver Nitrate End Time: 09:39 Procedural Pain: 0 Post Procedural Pain: 0 Response to Treatment: Procedure was tolerated well Level of Consciousness  Terri Burnett, Terri Burnett ([id number]) [id number] Page 2 of 9 Level of Consciousness (Post- Awake and Alert procedure): Post Debridement Measurements of Total Wound Length: (cm) 1.2 Width: (cm) 1 Depth: (cm) 0.4 Volume: (cm) 0.377 Character of Wound/Ulcer Post Debridement: Improved Post Procedure Diagnosis Same as Pre-procedure Electronic Signature(s) Signed: 03/08/2022 5:09:00 PM By: Terri Keeler PA-C Signed: 03/09/2022 4:29:22 PM By: Terri Coria RN Entered By: Terri Burnett on 03/07/2022 09:43:41 -------------------------------------------------------------------------------- HPI Details Patient Name: Date of Service: Terri Burnett. 03/07/2022 8:45 A Burnett Medical Record Number: [id number] Patient Account Number: [id number] Date of Birth/Sex: Treating RN: 12/15/1955 (67 y.o. Terri Burnett Primary Care Provider: Beverlyn Burnett Other Clinician: Referring Provider: Treating Provider/Extender: Terri Burnett in Treatment: 0 History of Present Illness HPI Description: 03-07-2022 upon evaluation today patient presents for initial inspection here in our clinic concerning issues she has been having with a wound on the right lateral ankle after she had a fall with ankle fracture on the beginning part of November to middle 2023. She tells me she was hospitalized on November 14 where she did have internal fixation of the ankle fracture. With that being said unfortunately she developed an issue following the discharge where the wound did not heal when the staples were eventually removed this was still not closed and dehisced to some degree. Fortunately there does not appear to be any evidence of infection locally nor systemically which is great news. No fevers, chills, nausea, vomiting, or diarrhea. Patient does have a history of diabetes mellitus type 2, hypertension, stroke where she does still have some right-sided weakness noted  currently, and again a history of seizures though she is not sure exactly why this happened as of yet. Electronic Signature(s) Signed: 03/07/2022 2:39:16 PM By: Terri Keeler PA-C Entered By: Terri Burnett on 03/07/2022 14:39:16 -------------------------------------------------------------------------------- Physical Exam Details Patient Name: Date of Service: Terri Burnett, Terri Burnett 03/07/2022 8:45 A Burnett Medical Record Number: [id number] Patient Account Number: [id number] Date of Birth/Sex: Treating RN: 07-Sep-1955 (67 y.o. F) Terri Burnett ([id number]) [id number] Page 3 of 9 Primary Care Provider: Beverely Burnett Other Clinician: Referring Provider: Treating Provider/Extender: Donette Larry in Treatment: 0 Constitutional patient is hypertensive.. pulse regular and within target range for patient.Marland Kitchen respirations regular, non-labored and within target range for patient.Marland Kitchen temperature within target range for patient.. Well-nourished and well-hydrated in no acute distress. Eyes conjunctiva clear no eyelid edema noted. pupils equal round and reactive to light and accommodation. Ears, Nose, Mouth, and Throat no gross abnormality of ear auricles or external auditory canals. normal hearing noted during conversation. mucus membranes moist. Respiratory normal breathing without difficulty. Cardiovascular 1+ dorsalis pedis/posterior tibialis pulses. 2+ pitting edema of the bilateral lower extremities. Musculoskeletal normal gait and posture. Psychiatric this patient is able to make decisions and demonstrates good insight into disease process. Alert and Oriented x 3. pleasant and cooperative. Notes Upon inspection patient's wound bed actually showed signs of good granulation epithelization at this point. Fortunately there does not appear to be any signs of infection locally or systemically which is great news and I am very pleased  in that regard. With that being said she is very pleasant and does seem to be making some progress here although I think is very slow in regard to the ankle. I do believe there are some things that we can do to possibly help speed this up and I discussed that with her today. She does have some swelling as far as her legs are concerned that again once we know she has good blood flow I think she could benefit from compression therapy. Electronic Signature(s) Signed: 03/07/2022 2:39:59 PM By: Lenda Kelp PA-C Entered By: Lenda Kelp on 03/07/2022 14:39:59 -------------------------------------------------------------------------------- Physician Orders Details Patient Name: Date of Service: Terri Burnett. 03/07/2022 8:45 A Burnett Medical Record Number: [id number] Patient Account Number: [id number] Date of Birth/Sex: Treating RN: 1955/12/03 (67 y.o. 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 Terri Burnett, Terri Burnett E ([id number]) [id number] Page 4 of 9 Bathing/ Shower/ Hygiene May shower with wound dressing protected with water repellent cover or cast protector. No tub bath. Anesthetic (Use 'Patient Medications' Section for Anesthetic Order Entry) Lidocaine applied to wound bed Edema Control - Lymphedema / Segmental  Compressive Device / Other Elevate, Exercise Daily and A void Standing for Long Periods of Time. Elevate legs to the level of the heart and pump ankles as often as possible Elevate leg(s) parallel to the floor when sitting. Off-Loading Open toe surgical shoe Wound Treatment Wound #1 - Ankle Wound Laterality: Right, Lateral Cleanser: Soap and Water 3 x Per Week/30 Days Discharge Instructions: Gently cleanse wound with antibacterial soap, rinse and pat dry prior to dressing wounds Prim Dressing: Prisma 4.34 (in) (Generic) 3 x Per Week/30 Days ary Discharge Instructions: Moisten w/normal saline or sterile water; Cover wound as directed. Do not remove from wound bed. Secondary Dressing: (BORDER) Zetuvit Plus SILICONE BORDER Dressing 4x4 (in/in) (DME) (Generic) 3 x Per Week/30 Days Discharge Instructions: Please do not put silicone bordered dressings under wraps. Use  non-bordered dressing only. Secured With: Tubigrip Size E, 3.5x10 (in/yds) 3 x Per Week/30 Days Discharge Instructions: Apply 3 Tubigrip E 3-finger-widths below knee to base of toes to secure dressing and/or for swelling. Consults Vascular - [facility] for ABI's and TBI's - (ICD10 L97.312 - Non-pressure chronic ulcer of right ankle with fat layer exposed) Electronic Signature(s) Signed: 03/08/2022 5:09:00 PM By: Terri Keeler PA-C Signed: 03/09/2022 4:29:22 PM By: Terri Coria RN Entered By: Terri Burnett on 03/07/2022 09:43:03 -------------------------------------------------------------------------------- Problem List Details Patient Name: Date of Service: Terri Burnett. 03/07/2022 8:45 A Burnett Medical Record Number: [id number] Patient Account Number: [id number] Date of Birth/Sex: Treating RN: 04/17/1955 (67 y.o. Terri Burnett Primary Care Provider: Beverlyn Burnett Other Clinician: Referring Provider: Treating Provider/Extender: Terri Burnett in Treatment: 0 Active Problems ICD-10 Encounter Code  Description Active Date MDM Diagnosis T81.31XA Disruption of external operation (surgical) wound, not elsewhere classified, 03/07/2022 No Yes initial encounter L97.312 Non-pressure chronic ulcer of right ankle with fat layer exposed 03/07/2022 No Yes Terri Burnett, Terri Burnett ([id number]) [id number] Page 5 of 9 E11.622 Type 2 diabetes mellitus with other skin ulcer 03/07/2022 No Yes I10 Essential (primary) hypertension 03/07/2022 No Yes I69.351 Hemiplegia and hemiparesis following cerebral infarction affecting right 03/07/2022 No Yes dominant side G40.89 Other seizures 03/07/2022 No Yes Inactive Problems Resolved Problems Electronic Signature(s) Signed: 03/07/2022 9:30:53 AM By: Terri Keeler PA-C Entered By: Terri Burnett on 03/07/2022 09:30:52 -------------------------------------------------------------------------------- Progress Note Details Patient Name: Date of Service: Terri Burnett. 03/07/2022 8:45 A Burnett Medical Record Number: [id number] Patient Account Number: [id number] Date of Birth/Sex: Treating RN: Nov 16, 1955 (67 y.o. Terri Burnett Primary Care Provider: Beverlyn Burnett Other Clinician: Referring Provider: Treating Provider/Extender: Terri Burnett in Treatment: 0 Subjective Chief Complaint Information obtained from Patient Right lateral ankle ulcer History of Present Illness (HPI) 03-07-2022 upon evaluation today patient presents for initial inspection here in our clinic concerning issues she has been having with a wound on the right lateral ankle after she had a fall with ankle fracture on the beginning part of November to middle 2023. She tells me she was hospitalized on November 14 where she did have internal fixation of the ankle fracture. With that being said unfortunately she developed an issue following the discharge where the wound did not heal when the staples were eventually removed this was still not closed and  dehisced to some degree. Fortunately there does not appear to be any evidence of infection locally nor systemically which is great news. No fevers, chills, nausea, vomiting, or diarrhea. Patient does have a history of diabetes mellitus type 2, hypertension, stroke where she does still have some right-sided weakness noted currently, and again a history of seizures though she is not sure exactly why this happened as of yet. Patient History Allergies No Known Allergies Social History Former smoker, Marital Status - Divorced, Alcohol Use - Never, Drug Use - No History, Caffeine Use - Moderate. Medical History Cardiovascular Patient has history of Hypertension Denies history of Hypotension Endocrine Terri Burnett, Terri Burnett ([id number]) [id number] Page 6 of 9 Patient has history of Type II Diabetes - 10 years Patient is treated with Oral Agents. Blood sugar is not tested. Review of Systems (ROS) Integumentary (Skin) Complains or has symptoms of Wounds, Swelling. Objective Constitutional patient is hypertensive.. pulse regular and within target range for patient.Marland Kitchen respirations regular, non-labored and within target range for patient.Marland Kitchen temperature within target range for patient.. Well-nourished and well-hydrated in no acute distress. Vitals Time  Taken: 8:49 AM, Height: 67 in, Source: Stated, Weight: 205 lbs, Source: Stated, BMI: 32.1, Temperature: 98.1 F, Pulse: 89 bpm, Respiratory Rate: 16 breaths/min, Blood Pressure: 147/86 mmHg. Eyes conjunctiva clear no eyelid edema noted. pupils equal round and reactive to light and accommodation. Ears, Nose, Mouth, and Throat no gross abnormality of ear auricles or external auditory canals. normal hearing noted during conversation. mucus membranes moist. Respiratory normal breathing without difficulty. Cardiovascular 1+ dorsalis pedis/posterior tibialis pulses. 2+ pitting edema of the bilateral lower  extremities. Musculoskeletal normal gait and posture. Psychiatric this patient is able to make decisions and demonstrates good insight into disease process. Alert and Oriented x 3. pleasant and cooperative. General Notes: Upon inspection patient's wound bed actually showed signs of good granulation epithelization at this point. Fortunately there does not appear to be any signs of infection locally or systemically which is great news and I am very pleased in that regard. With that being said she is very pleasant and does seem to be making some progress here although I think is very slow in regard to the ankle. I do believe there are some things that we can do to possibly help speed this up and I discussed that with her today. She does have some swelling as far as her legs are concerned that again once we know she has good blood flow I think she could benefit from compression therapy. Integumentary (Hair, Skin) Wound #1 status is Open. Original cause of wound was Surgical Injury. The date acquired was: 01/25/2022. The wound is located on the Right,Lateral Ankle. The wound measures 1.2cm length x 1cm width x 0.4cm depth; 0.942cm^2 area and 0.377cm^3 volume. There is no tunneling or undermining noted. There is a medium amount of serosanguineous drainage noted. There is no granulation within the wound bed. There is a large (67-100%) amount of necrotic tissue within the wound bed including Adherent Slough. Assessment Active Problems ICD-10 Disruption of external operation (surgical) wound, not elsewhere classified, initial encounter Non-pressure chronic ulcer of right ankle with fat layer exposed Type 2 diabetes mellitus with other skin ulcer Essential (primary) hypertension Hemiplegia and hemiparesis following cerebral infarction affecting right dominant side Other seizures Procedures Wound #1 Pre-procedure diagnosis of Wound #1 is a Dehisced Wound located on the Right,Lateral Ankle . There was a  Excisional Skin/Subcutaneous Tissue Debridement with a total area of 1.2 sq cm performed by Nelida MeuseStone, Danilynn Jemison E., PA-C. With the following instrument(s): Curette to remove Viable and Non-Viable tissue/material. Material removed includes Subcutaneous Tissue, Slough, Skin: Dermis, and Skin: Epidermis. No specimens were taken. A time out was conducted at 09:35, prior to the start of the procedure. A Moderate amount of bleeding was controlled with Silver Nitrate. The procedure was tolerated well with a pain level of 0 throughout and a pain level of 0 following the procedure. Post Debridement Measurements: 1.2cm length x 1cm width x 0.4cm depth; 0.377cm^3 volume. Character of Wound/Ulcer Post Debridement is improved. Post procedure Diagnosis Wound #1: Same as Pre-Procedure Terri LaundryBRINCEFIELD, Terri Burnett ([id number]) [id number] Page 7 of 9 Plan Follow-up Appointments: Return Appointment in 1 week. Bathing/ Shower/ Hygiene: May shower with wound dressing protected with water repellent cover or cast protector. No tub bath. Anesthetic (Use 'Patient Medications' Section for Anesthetic Order Entry): Lidocaine applied to wound bed Edema Control - Lymphedema / Segmental Compressive Device / Other: Elevate, Exercise Daily and Avoid Standing for Long Periods of Time. Elevate legs to the level of the heart and pump ankles as often as possible Elevate leg(s)  parallel to the floor when sitting. Off-Loading: Open toe surgical shoe Consults ordered were: Vascular - AVVS for ABI's and TBI's WOUND #1: - Ankle Wound Laterality: Right, Lateral Cleanser: Soap and Water 3 x Per Week/30 Days Discharge Instructions: Gently cleanse wound with antibacterial soap, rinse and pat dry prior to dressing wounds Prim Dressing: Prisma 4.34 (in) (Generic) 3 x Per Week/30 Days ary Discharge Instructions: Moisten w/normal saline or sterile water; Cover wound as directed. Do not remove from wound bed. Secondary  Dressing: (BORDER) Zetuvit Plus SILICONE BORDER Dressing 4x4 (in/in) (DME) (Generic) 3 x Per Week/30 Days Discharge Instructions: Please do not put silicone bordered dressings under wraps. Use non-bordered dressing only. Secured With: Tubigrip Size E, 3.5x10 (in/yds) 3 x Per Week/30 Days Discharge Instructions: Apply 3 Tubigrip E 3-finger-widths below knee to base of toes to secure dressing and/or for swelling. 1. I am good recommend that we have the patient continue to over the next few weeks monitor for any signs of worsening or infection while we get the arterial study with ABI and TBI ordered. 2. I am also can recommend that we use a silver collagen followed by bordered foam dressing and then Tubigrip size E for the time being. 3. I am also can recommend the patient should continue to elevate her legs is much as possible when she is sitting the more that she can do the better off she will be. We will see patient back for reevaluation in 1 week here in the clinic. If anything worsens or changes patient will contact our office for additional recommendations. Electronic Signature(s) Signed: 03/07/2022 2:40:40 PM By: Lenda Kelp PA-C Entered By: Lenda Kelp on 03/07/2022 14:40:39 -------------------------------------------------------------------------------- ROS/PFSH Details Patient Name: Date of Service: Terri Burnett. 03/07/2022 8:45 A Burnett Medical Record Number: [id number] Patient Account Number: [id number] Date of Birth/Sex: Treating RN: 04/19/1955 (67 y.o. Freddy Finner Primary Care Provider: Beverely Burnett Other Clinician: Referring Provider: Treating Provider/Extender: Donette Larry in Treatment: 0 Integumentary (Skin) Complaints and Symptoms: Positive for: Wounds; Swelling Cardiovascular Medical History: Positive for: Hypertension Negative for: Hypotension Terri Burnett, Terri Burnett ([id number]) [id number] Page 8 of  9 Endocrine Medical History: Positive for: Type II Diabetes - 10 years Time with diabetes: 10 years Treated with: Oral agents Blood sugar tested every day: No Immunizations Pneumococcal Vaccine: Received Pneumococcal Vaccination: No Implantable Devices None Family and Social History Former smoker; Marital Status - Divorced; Alcohol Use: Never; Drug Use: No History; Caffeine Use: Moderate Electronic Signature(s) Signed: 03/08/2022 5:09:00 PM By: Lenda Kelp PA-C Signed: 03/09/2022 4:29:22 PM By: Yevonne Pax RN Entered By: Yevonne Pax on 03/07/2022 08:52:56 -------------------------------------------------------------------------------- SuperBill Details Patient Name: Date of Service: Terri Burnett. 03/07/2022 Medical Record Number: [id number] Patient Account Number: [id number] Date of Birth/Sex: Treating RN: 06-Aug-1955 (67 y.o. 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 G40.89 Other seizures Facility Procedures : CPT4 Code: [id number] Description: 99213 - WOUND CARE VISIT-LEV 3 EST PT Modifier: Quantity: 1 Physician Procedures : CPT4 Code Description Modifier [id number] WC PHYS LEVEL 3 NEW PT 25 ICD-10 Diagnosis Description T81.31XA Disruption of external operation (  surgical) wound, not elsewhere classified, initial encounter L97.312 Non-pressure chronic ulcer of right ankle with  fat layer exposed E11.622 Type 2 diabetes mellitus with other  skin ulcer I10 Essential (primary) hypertension JALAYLA, CHRISMER ([id number]) [id number] P Quantity: 1 age 36 of 26 Electronic Signature(s) Signed: 03/07/2022 2:41:14 PM By: Lenda Kelp PA-C Entered By: Lenda Kelp on 03/07/2022 14:41:14

## 2022-03-09 NOTE — Progress Notes (Signed)
LAVERTA, HARNISCH W ([id number]) [id number] Page 1 of 9 Visit Report for 03/07/2022 Allergy List Details Patient Name: Date of Service: Terri, Burnett. 03/07/2022 8:45 A Burnett Medical Record Number: [id number] Patient Account Number: [id number] Date of Birth/Sex: Treating RN: Sep 23, 1955 (67 y.o. Terri Burnett Primary Care Aarib Pulido: Beverlyn Roux Other Clinician: Referring Naelani Lafrance: Treating Samiksha Pellicano/Extender: Barton Fanny Weeks in Treatment: 0 Allergies Active Allergies No Known Allergies Allergy Notes Electronic Signature(s) Signed: 03/09/2022 4:29:22 PM By: Carlene Coria RN Entered By: Carlene Coria on 03/07/2022 08:50:02 -------------------------------------------------------------------------------- Arrival Information Details Patient Name: Date of Service: Terri Burnett. 03/07/2022 8:45 A Burnett Medical Record Number: [id number] Patient Account Number: [id number] Date of Birth/Sex: Treating RN: [birth year] (67 y.o. Terri Burnett Primary Care Kieu Quiggle: Beverlyn Roux Other Clinician: Referring Ileana Chalupa: Treating Jaspal Pultz/Extender: Danna Hefty in Treatment: 0 Visit Information Patient Arrived: Wheel Chair Arrival Time: 08:48 Accompanied By: daughter Transfer Assistance: None Patient Identification Verified: Yes Secondary Verification Process Completed: Yes Patient Requires Transmission-Based Precautions: No Patient Has Alerts: Yes Patient Alerts: Patient on Blood Thinner DIABETIC Electronic Signature(s) Signed: 03/09/2022 4:29:22 PM By: Carlene Coria RN Redgie Grayer, Maryfrances Burnett ([id number]) [id number] Page 2 of 9 Entered By: Carlene Coria on 03/07/2022 08:48:53 -------------------------------------------------------------------------------- Clinic Level of Care Assessment Details Patient Name: Date of Service: Terri, Burnett 03/07/2022 8:45 A Burnett Medical Record Number:  [id number] Patient Account Number: [id number] Date of Birth/Sex: Treating RN: May 29, 1955 (67 y.o. Terri Burnett Primary Care Julia Kulzer: Beverlyn Roux Other Clinician: Referring Carrisa Keller: Treating Kandiss Ihrig/Extender: Danna Hefty in Treatment: 0 Clinic Level of Care Assessment Items TOOL 1 Quantity Score X- 1 0 Use when EandM and Procedure is performed on INITIAL visit ASSESSMENTS - Nursing Assessment / Reassessment X- 1 20 General Physical Exam (combine w/ comprehensive assessment (listed just below) when performed on new pt. evals) X- 1 25 Comprehensive Assessment (HX, ROS, Risk Assessments, Wounds Hx, etc.) ASSESSMENTS - Wound and Skin Assessment / Reassessment []  - 0 Dermatologic / Skin Assessment (not related to wound area) ASSESSMENTS - Ostomy and/or Continence Assessment and Care []  - 0 Incontinence Assessment and Management []  - 0 Ostomy Care Assessment and Management (repouching, etc.) PROCESS - Coordination of Care X - Simple Patient / Family Education for ongoing care 1 15 []  - 0 Complex (extensive) Patient / Family Education for ongoing care X- 1 10 Staff obtains Programmer, systems, Records, T Results / Process Orders est []  - 0 Staff telephones HHA, Nursing Homes / Clarify orders / etc []  - 0 Routine Transfer to another Facility (non-emergent condition) []  - 0 Routine Hospital Admission (non-emergent condition) X- 1 15 New Admissions / Biomedical engineer / Ordering NPWT Apligraf, etc. , []  - 0 Emergency Hospital Admission (emergent condition) PROCESS - Special Needs []  - 0 Pediatric / Minor Patient Management []  - 0 Isolation Patient Management []  - 0 Hearing / Language / Visual special needs []  - 0 Assessment of Community assistance (transportation, D/C planning, etc.) []  - 0 Additional assistance / Altered mentation []  - 0 Support Surface(s) Assessment (bed, cushion, seat, etc.) INTERVENTIONS - Miscellaneous []  - 0 External ear  exam []  - 0 Patient Transfer (multiple staff / Civil Service fast streamer / Similar devices) []  - 0 Simple Staple / Suture removal (25 or less) []  - 0 Complex Staple / Suture removal (26 or more) Terri, RANIERI Burnett ([id number]) [id number] Page 3 of 9 []  - 0 Hypo/Hyperglycemic Management (do not check if billed separately) X- 1 15 Ankle / Brachial Index (ABI) -  do not check if billed separately Has the patient been seen at the hospital within the last three years: Yes Total Score: 100 Level Of Care: New/Established - Level 3 Electronic Signature(s) Signed: 03/09/2022 4:29:22 PM By: Carlene Coria RN Entered By: Carlene Coria on 03/07/2022 09:41:23 -------------------------------------------------------------------------------- Encounter Discharge Information Details Patient Name: Date of Service: Terri Burnett. 03/07/2022 8:45 A Burnett Medical Record Number: [id number] Patient Account Number: [id number] Date of Birth/Sex: Treating RN: [birth year] (67 y.o. Terri Burnett Primary Care Torry Adamczak: Beverlyn Roux Other Clinician: Referring Izzabell Klasen: Treating Karolynn Infantino/Extender: Danna Hefty in Treatment: 0 Encounter Discharge Information Items Post Procedure Vitals Discharge Condition: Stable Temperature (F): 98.1 Ambulatory Status: Wheelchair Pulse (bpm): 89 Discharge Destination: Home Respiratory Rate (breaths/min): 16 Transportation: Private Auto Blood Pressure (mmHg): 147/86 Accompanied By: daughter Schedule Follow-up Appointment: Yes Clinical Summary of Care: Electronic Signature(s) Signed: 03/09/2022 4:29:22 PM By: Carlene Coria RN Entered By: Carlene Coria on 03/07/2022 09:42:20 -------------------------------------------------------------------------------- Lower Extremity Assessment Details Patient Name: Date of Service: Terri, Burnett 03/07/2022 8:45 A Burnett Medical Record Number: [id number] Patient Account Number: [id number] Date  of Birth/Sex: Treating RN: November 11, 1955 (67 y.o. Terri Burnett Primary Care Athens Lebeau: Beverlyn Roux Other Clinician: Referring Nadalie Laughner: Treating Libertie Hausler/Extender: Danna Hefty in Treatment: 0 Edema Assessment Assessed: [Left: No] [Right: No] Edema: [Left: Ye] [Right: s] B[Left: Terri Burnett, Terri Burnett ([id number])] [Right: [id number] Page 4 of 9] Calf Left: Right: Point of Measurement: 38 cm From Medial Instep 34 cm Ankle Left: Right: Point of Measurement: 11 cm From Medial Instep 22 cm Knee To Floor Left: Right: From Medial Instep 48 cm Vascular Assessment Pulses: Dorsalis Pedis Palpable: [Right:Yes] Doppler Audible: [Right:Yes] Blood Pressure: Brachial: [Right:147] Ankle: [Right:Dorsalis Pedis: 100 0.68] Electronic Signature(s) Signed: 03/09/2022 4:29:22 PM By: Carlene Coria RN Entered By: Carlene Coria on 03/07/2022 09:13:51 -------------------------------------------------------------------------------- Multi Wound Chart Details Patient Name: Date of Service: Terri Burnett. 03/07/2022 8:45 A Burnett Medical Record Number: [id number] Patient Account Number: [id number] Date of Birth/Sex: Treating RN: April 14, 1955 (67 y.o. Terri Burnett Primary Care Shayden Bobier: Beverlyn Roux Other Clinician: Referring Elohim Brune: Treating Autrey Human/Extender: Danna Hefty in Treatment: 0 Vital Signs Height(in): 67 Pulse(bpm): 89 Weight(lbs): 205 Blood Pressure(mmHg): 147/86 Body Mass Index(BMI): 32.1 Temperature(F): 98.1 Respiratory Rate(breaths/min): 16 [1:Photos:] [N/A:N/A] Right, Lateral Ankle N/A N/A Wound Location: Surgical Injury N/A N/A Wounding Event: Dehisced Wound N/A N/A Primary Etiology: Hypertension, Type II Diabetes N/A N/A Comorbid History: LISSANDRA, KEIL ([id number]) [id number] Page 5 of 9 01/25/2022 N/A N/A Date Acquired: 0 N/A N/A Weeks of Treatment: Open N/A  N/A Wound Status: No N/A N/A Wound Recurrence: 1.2x1x0.4 N/A N/A Measurements L x W x D (cm) 0.942 N/A N/A A (cm) : rea 0.377 N/A N/A Volume (cm) : Full Thickness Without Exposed N/A N/A Classification: Support Structures Medium N/A N/A Exudate Amount: Serosanguineous N/A N/A Exudate Type: red, brown N/A N/A Exudate Color: None Present (0%) N/A N/A Granulation Amount: Large (67-100%) N/A N/A Necrotic Amount: Fascia: No N/A N/A Exposed Structures: Fat Layer (Subcutaneous Tissue): No Tendon: No Muscle: No Joint: No Bone: No None N/A N/A Epithelialization: Treatment Notes Electronic Signature(s) Signed: 03/07/2022 9:19:14 AM By: Carlene Coria RN Entered By: Carlene Coria on 03/07/2022 09:19:14 -------------------------------------------------------------------------------- Multi-Disciplinary Care Plan Details Patient Name: Date of Service: Terri Burnett. 03/07/2022 8:45 A Burnett Medical Record Number: [id number] Patient Account Number: [id number] Date of Birth/Sex: Treating RN: January 21, 1956 (67 y.o. Terri Burnett Primary Care Lillee Mooneyhan: Beverlyn Roux Other Clinician: Referring Sparsh Callens: Treating Lucindy Borel/Extender: Joaquim Lai,  Aurelio Jew, Caryn Bee Weeks in Treatment: 0 Active Inactive Abuse / Safety / Falls / Self Care Management Nursing Diagnoses: Potential for injury related to falls Goals: Patient will not experience any injury related to falls Date Initiated: 03/07/2022 Target Resolution Date: 04/07/2022 Goal Status: Active Interventions: Assess Activities of Daily Living upon admission and as needed Assess fall risk on admission and as needed Assess: immobility, friction, shearing, incontinence upon admission and as needed Assess impairment of mobility on admission and as needed per policy Assess personal safety and home safety (as indicated) on admission and as needed Assess self care needs on admission and as needed Notes: Necrotic Tissue Terri, Burnett ([id number]) [id number] Page 6 of 9 Nursing Diagnoses: Knowledge deficit related to management of necrotic/devitalized tissue Goals: Patient/caregiver will verbalize understanding of reason and process for debridement of necrotic tissue Date Initiated: 03/07/2022 Target Resolution Date: 04/07/2022 Goal Status: Active Interventions: Assess patient pain level pre-, during and post procedure and prior to discharge Provide education on necrotic tissue and debridement process Notes: Wound/Skin Impairment Nursing Diagnoses: Knowledge deficit related to ulceration/compromised skin integrity Goals: Patient/caregiver will verbalize understanding of skin care regimen Date Initiated: 03/07/2022 Target Resolution Date: 04/07/2022 Goal Status: Active Ulcer/skin breakdown will have a volume reduction of 30% by week 4 Date Initiated: 03/07/2022 Target Resolution Date: 04/07/2022 Goal Status: Active Ulcer/skin breakdown will have a volume reduction of 50% by week 8 Date Initiated: 03/07/2022 Target Resolution Date: 05/06/2022 Goal Status: Active Ulcer/skin breakdown will have a volume reduction of 80% by week 12 Date Initiated: 03/07/2022 Target Resolution Date: 06/06/2022 Goal Status: Active Ulcer/skin breakdown will heal within 14 weeks Date Initiated: 03/07/2022 Target Resolution Date: 07/06/2022 Goal Status: Active Interventions: Assess patient/caregiver ability to obtain necessary supplies Assess patient/caregiver ability to perform ulcer/skin care regimen upon admission and as needed Assess ulceration(s) every visit Notes: Electronic Signature(s) Signed: 03/07/2022 9:18:54 AM By: Yevonne Pax RN Entered By: Yevonne Pax on 03/07/2022 09:18:54 -------------------------------------------------------------------------------- Pain Assessment Details Patient Name: Date of Service: Terri Burnett. 03/07/2022 8:45 A Burnett Medical Record Number:  [id number] Patient Account Number: [id number] Date of Birth/Sex: Treating RN: [birth year] (67 y.o. Freddy Finner Primary Care Amena Dockham: Beverely Low Other Clinician: Referring Mariusz Jubb: Treating Zackrey Dyar/Extender: Donette Larry in Treatment: 0 Active Problems Location of Pain Severity and Description of Pain Patient Has Paino No Site Locations [place], [place] Judie Petit ([id number]) [id number] Page 7 of 9 Pain Management and Medication Current Pain Management: Electronic Signature(s) Signed: 03/09/2022 4:29:22 PM By: Yevonne Pax RN Entered By: Yevonne Pax on 03/07/2022 08:49:05 -------------------------------------------------------------------------------- Patient/Caregiver Education Details Patient Name: Date of Service: Terri Burnett 1/15/2024andnbsp8:45 A Burnett Medical Record Number: [id number] Patient Account Number: [id number] Date of Birth/Gender: Treating RN: Dec 29, 1955 (67 y.o. Freddy Finner Primary Care Physician: Beverely Low Other Clinician: Referring Physician: Treating Physician/Extender: Donette Larry in Treatment: 0 Education Assessment Education Provided To: Patient Education Topics Provided Welcome T [facility]New [facility] Packet: o Methods: Explain/Verbal Responses: State content correctly Electronic Signature(s) Signed: 03/09/2022 4:29:22 PM By: Yevonne Pax RN Entered By: Yevonne Pax on 03/07/2022 09:17:08 Terri Burnett ([id number]) [id number] Page 8 of 9 -------------------------------------------------------------------------------- Wound Assessment Details Patient Name: Date of Service: Terri, Burnett 03/07/2022 8:45 A Burnett Medical Record Number: [id number] Patient Account Number: [id number] Date of Birth/Sex: Treating RN: 15-Jul-1955 (67 y.o. Freddy Finner Primary Care Nashley Cordoba: Beverely Low Other Clinician: Referring  Jamichael Knotts: Treating Forrest Demuro/Extender: Donette Larry in Treatment: 0 Wound Status Wound Number:  1 Primary Etiology: Dehisced Wound Wound Location: Right, Lateral Ankle Wound Status: Open Wounding Event: Surgical Injury Comorbid History: Hypertension, Type II Diabetes Date Acquired: 01/25/2022 Weeks Of Treatment: 0 Clustered Wound: No Photos Wound Measurements Length: (cm) 1.2 Width: (cm) 1 Depth: (cm) 0.4 Area: (cm) 0.942 Volume: (cm) 0.377 % Reduction in Area: % Reduction in Volume: Epithelialization: None Tunneling: No Undermining: No Wound Description Classification: Full Thickness Without Exposed Support Structures Exudate Amount: Medium Exudate Type: Serosanguineous Exudate Color: red, brown Foul Odor After Cleansing: No Slough/Fibrino Yes Wound Bed Granulation Amount: None Present (0%) Exposed Structure Necrotic Amount: Large (67-100%) Fascia Exposed: No Necrotic Quality: Adherent Slough Fat Layer (Subcutaneous Tissue) Exposed: No Tendon Exposed: No Muscle Exposed: No Joint Exposed: No Bone Exposed: No Treatment Notes Wound #1 (Ankle) Wound Laterality: Right, Lateral Cleanser Soap and Water Discharge Instruction: Gently cleanse wound with antibacterial soap, rinse and pat dry prior to dressing wounds Peri-Wound Care Terri, Burnett ([id number]) [id number] Page 9 of 9 Topical Primary Dressing Prisma 4.34 (in) Discharge Instruction: Moisten w/normal saline or sterile water; Cover wound as directed. Do not remove from wound bed. Secondary Dressing (BORDER) Zetuvit Plus SILICONE BORDER Dressing 4x4 (in/in) Discharge Instruction: Please do not put silicone bordered dressings under wraps. Use non-bordered dressing only. Secured With Tubigrip Size E, 3.5x10 (in/yds) Discharge Instruction: Apply 3 Tubigrip E 3-finger-widths below knee to base of toes to secure dressing and/or for swelling. Compression  Wrap Compression Stockings Add-Ons Electronic Signature(s) Signed: 03/09/2022 4:29:22 PM By: Yevonne Pax RN Entered By: Yevonne Pax on 03/07/2022 09:12:56 -------------------------------------------------------------------------------- Vitals Details Patient Name: Date of Service: Terri Burnett. 03/07/2022 8:45 A Burnett Medical Record Number: [id number] Patient Account Number: [id number] Date of Birth/Sex: Treating RN: 12-05-1955 (67 y.o. Freddy Finner Primary Care Zahira Brummond: Beverely Low Other Clinician: Referring Detria Cummings: Treating Sterling Mondo/Extender: Donette Larry in Treatment: 0 Vital Signs Time Taken: 08:49 Temperature (F): 98.1 Height (in): 67 Pulse (bpm): 89 Source: Stated Respiratory Rate (breaths/min): 16 Weight (lbs): 205 Blood Pressure (mmHg): 147/86 Source: Stated Reference Range: 80 - 120 mg / dl Body Mass Index (BMI): 32.1 Electronic Signature(s) Signed: 03/09/2022 4:29:22 PM By: Yevonne Pax RN Entered By: Yevonne Pax on 03/07/2022 08:49:48

## 2022-03-16 ENCOUNTER — Other Ambulatory Visit (INDEPENDENT_AMBULATORY_CARE_PROVIDER_SITE_OTHER): Payer: Self-pay | Admitting: Physician Assistant

## 2022-03-16 DIAGNOSIS — L97321 Non-pressure chronic ulcer of left ankle limited to breakdown of skin: Secondary | ICD-10-CM

## 2022-03-16 DIAGNOSIS — T8131XA Disruption of external operation (surgical) wound, not elsewhere classified, initial encounter: Secondary | ICD-10-CM

## 2022-03-17 ENCOUNTER — Encounter: Payer: 59 | Admitting: Internal Medicine

## 2022-03-17 DIAGNOSIS — E11622 Type 2 diabetes mellitus with other skin ulcer: Secondary | ICD-10-CM | POA: Diagnosis not present

## 2022-03-17 NOTE — Progress Notes (Signed)
FREEDOM, LOPEZPEREZ R ([id number]) [id number] Page 1 of 7 Visit Report for 03/17/2022 Debridement Details Patient Name: Date of Service: Terri Burnett, Terri Burnett. 03/17/2022 3:00 PM Medical Record Burnett: [id number] Patient Account Burnett: [id number] Date of Birth/Sex: Treating RN: 08/09/1955 (67 y.o. Orvan Falconer Primary Care Provider: Beverlyn Roux Other Clinician: Referring Provider: Treating Provider/Extender: RO BSO Delane Ginger, MICHA EL Charna Busman Weeks in Treatment: 1 Debridement Performed for Assessment: Wound #1 Right,Lateral Ankle Performed By: Physician Ricard Dillon, MD Debridement Type: Debridement Level of Consciousness (Pre-procedure): Awake and Alert Pre-procedure Verification/Time Out Yes - 15:30 Taken: Start Time: 15:30 T Area Debrided (L x W): otal 1 (cm) x 0.9 (cm) = 0.9 (cm) Tissue and other material debrided: Viable, Non-Viable, Subcutaneous, Hyper-granulation Level: Skin/Subcutaneous Tissue Debridement Description: Excisional Instrument: Forceps Bleeding: Moderate Hemostasis Achieved: Silver Nitrate End Time: 15:35 Procedural Pain: 0 Post Procedural Pain: 0 Response to Treatment: Procedure was tolerated well Level of Consciousness (Post- Awake and Alert procedure): Post Debridement Measurements of Total Wound Length: (cm) 1 Width: (cm) 0.9 Depth: (cm) 0.1 Volume: (cm) 0.071 Character of Wound/Ulcer Post Debridement: Improved Post Procedure Diagnosis Same as Pre-procedure Electronic Signature(s) Signed: 03/17/2022 3:58:07 PM By: Linton Ham MD Signed: 03/17/2022 4:03:24 PM By: Carlene Coria RN Entered By: Linton Ham on 03/17/2022 15:58:07 HPI Details -------------------------------------------------------------------------------- Terri Burnett ([id number]) [id number] Page 2 of 7 Patient Name: Date of Service: Terri Burnett, Terri Burnett 03/17/2022 3:00 PM Medical Record  Burnett: [id number] Patient Account Burnett: [id number] Date of Birth/Sex: Treating RN: Jan 16, 1956 (67 y.o. Orvan Falconer Primary Care Provider: Beverlyn Roux Other Clinician: Referring Provider: Treating Provider/Extender: Eldridge Dace, MICHA EL Charna Busman Weeks in Treatment: 1 History of Present Illness HPI Description: 03-07-2022 upon evaluation today patient presents for initial inspection here in our clinic concerning issues she has been having with a wound on the right lateral ankle after she had a fall with ankle fracture on the beginning part of November to middle 2023. She tells me she was hospitalized on November 14 where she did have internal fixation of the ankle fracture. With that being said unfortunately she developed an issue following the discharge where the wound did not heal when the staples were eventually removed this was still not closed and dehisced to some degree. Fortunately there does not appear to be any evidence of infection locally nor systemically which is great news. No fevers, chills, nausea, vomiting, or diarrhea. Patient does have a history of diabetes mellitus type 2, hypertension, stroke where she does still have some right-sided weakness noted currently, and again a history of seizures though she is not sure exactly why this happened as of yet. 1/25; right lateral ankle wound in the mid part of his surgical incision from a fracture that was initially obtained in November with wound dehiscence in December. We used silver collagen on this last week they are changing it every other day. She arrived in clinic today with some unusual probing hyper granulated tissue. This was nonadherent to the sides of the wound. Apparently quite a bit different from 10 days ago Electronic Signature(s) Signed: 03/17/2022 4:38:56 PM By: Linton Ham MD Entered By: Linton Ham on 03/17/2022  15:53:53 -------------------------------------------------------------------------------- Physical Exam Details Patient Name: Date of Service: Terri Burnett. 03/17/2022 3:00 PM Medical Record Burnett: [id number] Patient Account Burnett: [id number] Date of Birth/Sex: Treating RN: [birth year] (67 y.o. Orvan Falconer Primary Care Provider: Beverlyn Roux Other Clinician: Referring Provider: Treating Provider/Extender: RO BSO N, [place], Elena Weeks in  Treatment: 1 Constitutional Sitting or standing Blood Pressure is within target range for patient.. Pulse regular and within target range for patient.Marland Kitchen Respirations regular, non-labored and within target range.. Temperature is normal and within the target range for the patient.Marland Kitchen appears in no distress. Notes Wound exam; right lateral ankle. Protruding hyper granulated tissue. This apparently was not present last time. This was several millimeters above the edge of the wound. I used pickups and a #15 scalpel to remove this hemostasis with silver nitrate and direct pressure. There was no evidence of infection no palpable bone Electronic Signature(s) Signed: 03/17/2022 4:38:56 PM By: Baltazar Najjar MD Entered By: Baltazar Najjar on 03/17/2022 15:56:04 Terri Burnett, Terri Burnett ([id number]) [id number] Page 3 of 7 -------------------------------------------------------------------------------- Physician Orders Details Patient Name: Date of Service: Terri Burnett, Terri Burnett 03/17/2022 3:00 PM Medical Record Burnett: [id number] Patient Account Burnett: [id number] Date of Birth/Sex: Treating RN: 04/06/1955 (67 y.o. Freddy Finner Primary Care Provider: Beverely Low Other Clinician: Referring Provider: Treating Provider/Extender: RO BSO N, MICHA EL Lorrin Goodell Weeks in Treatment: 1 Verbal / Phone Orders: No Diagnosis Coding Follow-up Appointments Return Appointment in 1 week. Bathing/ Shower/  Hygiene May shower with wound dressing protected with water repellent cover or cast protector. No tub bath. Anesthetic (Use 'Patient Medications' Section for Anesthetic Order Entry) Lidocaine applied to wound bed Edema Control - Lymphedema / Segmental Compressive Device / Other Elevate, Exercise Daily and A void Standing for Long Periods of Time. Elevate legs to the level of the heart and pump ankles as often as possible Elevate leg(s) parallel to the floor when sitting. Off-Loading Open toe surgical shoe Wound Treatment Wound #1 - Ankle Wound Laterality: Right, Lateral Cleanser: Soap and Water Every Other Day/30 Days Discharge Instructions: Gently cleanse wound with antibacterial soap, rinse and pat dry prior to dressing wounds Prim Dressing: Prisma 4.34 (in) (Generic) Every Other Day/30 Days ary Discharge Instructions: Moisten w/normal saline or sterile water; Cover wound as directed. Do not remove from wound bed. Secondary Dressing: (BORDER) Zetuvit Plus SILICONE BORDER Dressing 4x4 (in/in) (Generic) Every Other Day/30 Days Discharge Instructions: Please do not put silicone bordered dressings under wraps. Use non-bordered dressing only. Secured With: Tubigrip Size E, 3.5x10 (in/yds) Every Other Day/30 Days Discharge Instructions: Apply 3 Tubigrip E 3-finger-widths below knee to base of toes to secure dressing and/or for swelling. Electronic Signature(s) Signed: 03/17/2022 4:03:24 PM By: Yevonne Pax RN Signed: 03/17/2022 4:38:56 PM By: Baltazar Najjar MD Entered By: Yevonne Pax on 03/17/2022 15:36:44 Z[place]le, Terri Burnett ([id number]) [id number] Page 4 of 7 -------------------------------------------------------------------------------- Problem List Details Patient Name: Date of Service: Terri Burnett, Terri Burnett 03/17/2022 3:00 PM Medical Record Burnett: [id number] Patient Account Burnett: [id number] Date of Birth/Sex: Treating RN: 1955-12-03 (67 y.o. 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 Freddy Finner Primary Care Provider: Beverely Low Other Clinician: Referring Provider: Treating Provider/Extender: Chauncey Mann, MICHA EL Vena [place] in Treatment: 1 Subjective [place], Carmelle M ([id number]) [id number] Page 5 of 7 History of Present Illness (HPI) 03-07-2022 upon evaluation today patient presents for initial inspection here in our clinic concerning issues she has been having with a wound on the right lateral ankle after she had a fall with ankle fracture on the beginning part of November to middle 2023. She tells me she was hospitalized on November 14 where she did have internal fixation of the ankle fracture. With that being said unfortunately she  developed an issue following the discharge where the wound did not heal when the staples were eventually removed this was still not closed and dehisced to some degree. Fortunately there does not appear to be any evidence of infection locally nor systemically which is great news. No fevers, chills, nausea, vomiting, or diarrhea. Patient does have a history of diabetes mellitus type 2, hypertension, stroke where she does still have some right-sided weakness noted currently, and again a history of seizures though she is not sure exactly why this happened as of yet. 1/25; right lateral ankle wound in the mid part of his surgical incision from a fracture that was initially obtained in November with wound dehiscence in December. We used silver collagen on this last week they are changing it every other day. She arrived in clinic today with some unusual probing hyper granulated tissue. This was nonadherent to the sides of the wound. Apparently quite a bit different from 10 days ago Objective Constitutional Sitting or standing Blood Pressure is within target range for patient.. Pulse regular and within target range for patient.Marland Kitchen Respirations regular, non-labored and within target range.. Temperature is normal and within the target range for the patient.Marland Kitchen appears in no distress. Vitals Time Taken: 3:10 PM, Height: 67 in, Weight: 205 lbs, BMI: 32.1, Temperature: 98.4 F, Pulse: 79 bpm, Respiratory Rate: 18 breaths/min, Blood Pressure: 144/75 mmHg. General Notes: Wound exam; right lateral ankle. Protruding hyper granulated tissue. This apparently was not present last time. This was several millimeters above the edge of the wound. I used pickups and a #15 scalpel to remove this hemostasis with silver nitrate and direct pressure. There was no evidence of infection no palpable bone Integumentary (Hair, Skin) Wound #1 status is Open. Original cause of wound was Surgical Injury. The date acquired was: 01/25/2022.  The wound has been in treatment 1 weeks. The wound is located on the Right,Lateral Ankle. The wound measures 1cm length x 0.9cm width x 0.1cm depth; 0.707cm^2 area and 0.071cm^3 volume. There is no tunneling or undermining noted. There is a medium amount of serosanguineous drainage noted. There is large (67-100%) hyper - granulation within the wound bed. There is a small (1-33%) amount of necrotic tissue within the wound bed including Adherent Slough. Assessment Active Problems ICD-10 Disruption of external operation (surgical) wound, not elsewhere classified, initial encounter Non-pressure chronic ulcer of right ankle with fat layer exposed Type 2 diabetes mellitus with other skin ulcer Essential (primary) hypertension Hemiplegia and hemiparesis following cerebral infarction affecting right dominant side Other seizures Procedures Wound #1 Pre-procedure diagnosis of Wound #1 is a Dehisced Wound located on the Right,Lateral Ankle . There was a Excisional Skin/Subcutaneous Tissue Debridement with a total area of 0.9 sq cm  performed by Ricard Dillon, MD. With the following instrument(s): Forceps to remove Viable and Non-Viable tissue/material. Material removed includes Subcutaneous Tissue and Hyper-granulation and. A time out was conducted at 15:30, prior to the start of the procedure. A Moderate amount of bleeding was controlled with Silver Nitrate. The procedure was tolerated well with a pain level of 0 throughout and a pain level of 0 following the procedure. Post Debridement Measurements: 1cm length x 0.9cm width x 0.1cm depth; 0.071cm^3 volume. Character of Wound/Ulcer Post Debridement is improved. Post procedure Diagnosis Wound #1: Same as Pre-Procedure Plan Follow-up Appointments: Return Appointment in 1 week. Bathing/ Shower/ Hygiene: May shower with wound dressing protected with water repellent cover or cast protector. No tub bath. Terri Burnett, Terri Burnett ([id number])  [id number] Page 6 of 7 Anesthetic (Use 'Patient Medications' Section for Anesthetic Order Entry): Lidocaine applied to wound bed Edema Control - Lymphedema / Segmental Compressive Device / Other: Elevate, Exercise Daily and Avoid Standing for Long Periods of Time. Elevate legs to the level of the heart and pump ankles as often as possible Elevate leg(s) parallel to the floor when sitting. Off-Loading: Open toe surgical shoe WOUND #1: - Ankle Wound Laterality: Right, Lateral Cleanser: Soap and Water Every Other Day/30 Days Discharge Instructions: Gently cleanse wound with antibacterial soap, rinse and pat dry prior to dressing wounds Prim Dressing: Prisma 4.34 (in) (Generic) Every Other Day/30 Days ary Discharge Instructions: Moisten w/normal saline or sterile water; Cover wound as directed. Do not remove from wound bed. Secondary Dressing: (BORDER) Zetuvit Plus SILICONE BORDER Dressing 4x4 (in/in) (Generic) Every Other Day/30 Days Discharge Instructions: Please do not put silicone bordered dressings under wraps. Use non-bordered dressing only. Secured With: Tubigrip Size E, 3.5x10 (in/yds) Every Other Day/30 Days Discharge Instructions: Apply 3 Tubigrip E 3-finger-widths below knee to base of toes to secure dressing and/or for swelling. 1. Post removal of the protruding hypergranulation we used silver collagen.Change every second day 2. Somewhat unusual this week. I did not see any evidence of infection, no palpable bone the granulation tissue postdebridement looked reasonably healthy Electronic Signature(s) Signed: 03/17/2022 4:38:56 PM By: Linton Ham MD Entered By: Linton Ham on 03/17/2022 15:57:19 -------------------------------------------------------------------------------- SuperBill Details Patient Name: Date of Service: Terri Burnett. 03/17/2022 Medical Record Burnett: [id number] Patient Account Burnett: [id number] Date of Birth/Sex:  Treating RN: 07/21/1955 (67 y.o. 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 L97.312 Non-pressure chronic ulcer of right ankle with fat layer exposed Quantity: 1 pdf Page 7 of 7 Electronic Signature(s) Signed: 03/17/2022 4:38:56 PM By: Linton Ham MD Entered By: Linton Ham on 03/17/2022 15:57:32

## 2022-03-17 NOTE — Progress Notes (Signed)
Terri Burnett [id number] ([id number]) [id number] Page 1 of 9 Visit Report for 03/17/2022 Arrival Information Details Patient Name: Date of Service: Terri Burnett, Terri Burnett. 03/17/2022 3:00 PM Medical Record Number: [id number] Patient Account Number: [id number] Date of Birth/Sex: Treating RN: 1955/06/04 (67 y.o. Orvan Falconer Primary Care Dyson Sevey: Beverlyn Roux Other Clinician: Referring Lilia Letterman: Treating Gaddiel Cullens/Extender: RO BSO N, MICHA EL Charna Busman Weeks in Treatment: 1 Visit Information History Since Last Visit Added or deleted any medications: No Patient Arrived: Wheel Chair Any new allergies or adverse reactions: No Arrival Time: 15:05 Had a fall or experienced change in No Accompanied By: self activities of daily living that may affect Transfer Assistance: None risk of falls: Patient Identification Verified: Yes Signs or symptoms of abuse/neglect since last visito No Secondary Verification Process Completed: Yes Hospitalized since last visit: No Patient Requires Transmission-Based Precautions: No Implantable device outside of the clinic excluding No Patient Has Alerts: Yes cellular tissue based products placed in the center Patient Alerts: Patient on Blood Thinner since last visit: DIABETIC Has Dressing in Place as Prescribed: Yes Has Compression in Place as Prescribed: Yes Pain Present Now: No Electronic Signature(s) Signed: 03/17/2022 4:03:24 PM By: Carlene Coria RN Entered By: Carlene Coria on 03/17/2022 15:10:02 -------------------------------------------------------------------------------- Clinic Level of Care Assessment Details Patient Name: Date of Service: Terri Burnett, Terri Burnett 03/17/2022 3:00 PM Medical Record Number: [id number] Patient Account Number: [id number] Date of Birth/Sex: Treating RN: Apr 06, 1955 (67 y.o. Orvan Falconer Primary Care Deleon Passe: Beverlyn Roux Other Clinician: Referring Toyia Jelinek: Treating  Abdinasir Spadafore/Extender: RO BSO N, MICHA EL Charna Busman Weeks in Treatment: 1 Clinic Level of Care Assessment Items TOOL 1 Quantity Score []  - 0 Use when EandM and Procedure is performed on INITIAL visit ASSESSMENTS - Nursing Assessment / Reassessment []  - 0 General Physical Exam (combine w/ comprehensive assessment (listed just below) when performed on new pt. evals) []  - 0 Comprehensive Assessment (HX, ROS, Risk Assessments, Wounds Hx, etc.) Terri Burnett, Terri Burnett ([id number]) [id number] Page 2 of 9 ASSESSMENTS - Wound and Skin Assessment / Reassessment []  - 0 Dermatologic / Skin Assessment (not related to wound area) ASSESSMENTS - Ostomy and/or Continence Assessment and Care []  - 0 Incontinence Assessment and Management []  - 0 Ostomy Care Assessment and Management (repouching, etc.) PROCESS - Coordination of Care []  - 0 Simple Patient / Family Education for ongoing care []  - 0 Complex (extensive) Patient / Family Education for ongoing care []  - 0 Staff obtains Programmer, systems, Records, T Results / Process Orders est []  - 0 Staff telephones HHA, Nursing Homes / Clarify orders / etc []  - 0 Routine Transfer to another Facility (non-emergent condition) []  - 0 Routine Hospital Admission (non-emergent condition) []  - 0 New Admissions / Biomedical engineer / Ordering NPWT Apligraf, etc. , []  - 0 Emergency Hospital Admission (emergent condition) PROCESS - Special Needs []  - 0 Pediatric / Minor Patient Management []  - 0 Isolation Patient Management []  - 0 Hearing / Language / Visual special needs []  - 0 Assessment of Community assistance (transportation, D/C planning, etc.) []  - 0 Additional assistance / Altered mentation []  - 0 Support Surface(s) Assessment (bed, cushion, seat, etc.) INTERVENTIONS - Miscellaneous []  - 0 External ear exam []  - 0 Patient Transfer (multiple staff / Civil Service fast streamer / Similar devices) []  - 0 Simple Staple / Suture  removal (25 or less) []  - 0 Complex Staple / Suture removal (26 or more) []  - 0 Hypo/Hyperglycemic Management (do not check if billed separately) []  - 0 Ankle / Brachial  Index (ABI) - do not check if billed separately Has the patient been seen at the hospital within the last three years: Yes Total Score: 0 Level Of Care: ____ Electronic Signature(s) Signed: 03/17/2022 4:03:24 PM By: Yevonne Pax RN Entered By: Yevonne Pax on 03/17/2022 15:36:56 -------------------------------------------------------------------------------- Encounter Discharge Information Details Patient Name: Date of Service: Terri Burnett. 03/17/2022 3:00 PM Medical Record Number: [id number] Patient Account Number: [id number] Date of Birth/Sex: Treating RN: 12-31-1955 (67 y.o. Freddy Finner Primary Care Heinrich Fertig: Beverely Low Other Clinician: Referring Megen Madewell: Treating Dorthula Bier/Extender: [address] Lorrin Goodell [place] ([id number]) [id number] Page 3 of 9 Weeks in Treatment: 1 Encounter Discharge Information Items Post Procedure Vitals Discharge Condition: Stable Temperature (F): 98.4 Ambulatory Status: Wheelchair Pulse (bpm): 79 Discharge Destination: Home Respiratory Rate (breaths/min): 18 Transportation: Private Auto Blood Pressure (mmHg): 144/75 Accompanied By: daughter Schedule Follow-up Appointment: Yes Clinical Summary of Care: Electronic Signature(s) Signed: 03/17/2022 4:03:24 PM By: Yevonne Pax RN Entered By: Yevonne Pax on 03/17/2022 15:37:53 -------------------------------------------------------------------------------- Lower Extremity Assessment Details Patient Name: Date of Service: Terri Burnett 03/17/2022 3:00 PM Medical Record Number: [id number] Patient Account Number: [id number] Date of Birth/Sex: Treating RN: Mar 04, 1955 (67 y.o. Freddy Finner Primary Care Lindsay Soulliere: Beverely Low Other Clinician: Referring  Marilouise Densmore: Treating Odeal Welden/Extender: RO BSO N, MICHA EL Shea Evans, Elena Weeks in Treatment: 1 Edema Assessment Assessed: [Left: No] [Right: No] Edema: [Left: Ye] [Right: s] Vascular Assessment Pulses: Dorsalis Pedis Palpable: [Right:Yes] Electronic Signature(s) Signed: 03/17/2022 4:03:24 PM By: Yevonne Pax RN Entered By: Yevonne Pax on 03/17/2022 15:15:24 -------------------------------------------------------------------------------- Multi Wound Chart Details Patient Name: Date of Service: Terri Burnett. 03/17/2022 3:00 PM Medical Record Number: [id number] Patient Account Number: [id number] Date of Birth/Sex: Treating RN: 1955/09/06 (67 y.o. Freddy Finner Primary Care Brytnee Bechler: Beverely Low Other Clinician: JAELIN, Terri Burnett ([id number]) [id number] Page 4 of 9 Referring Lucia Harm: Treating Joscelynn Brutus/Extender: RO BSO N, MICHA EL Lorrin Goodell Weeks in Treatment: 1 Vital Signs Height(in): 67 Pulse(bpm): 79 Weight(lbs): 205 Blood Pressure(mmHg): 144/75 Body Mass Index(BMI): 32.1 Temperature(F): 98.4 Respiratory Rate(breaths/min): 18 [1:Photos:] [N/A:N/A] Right, Lateral Ankle N/A N/A Wound Location: Surgical Injury N/A N/A Wounding Event: Dehisced Wound N/A N/A Primary Etiology: Hypertension, Type II Diabetes N/A N/A Comorbid History: 01/25/2022 N/A N/A Date Acquired: 1 N/A N/A Weeks of Treatment: Open N/A N/A Wound Status: No N/A N/A Wound Recurrence: 1x0.9x0.1 N/A N/A Measurements L x W x D (cm) 0.707 N/A N/A A (cm) : rea 0.071 N/A N/A Volume (cm) : 24.90% N/A N/A % Reduction in A rea: 81.20% N/A N/A % Reduction in Volume: Full Thickness Without Exposed N/A N/A Classification: Support Structures Medium N/A N/A Exudate Amount: Serosanguineous N/A N/A Exudate Type: red, brown N/A N/A Exudate Color: Large (67-100%) N/A N/A Granulation Amount: Small (1-33%) N/A N/A Necrotic Amount: Fascia: No N/A  N/A Exposed Structures: Fat Layer (Subcutaneous Tissue): No Tendon: No Muscle: No Joint: No Bone: No None N/A N/A Epithelialization: Treatment Notes Electronic Signature(s) Signed: 03/17/2022 4:03:24 PM By: Yevonne Pax RN Entered By: Yevonne Pax on 03/17/2022 15:15:28 -------------------------------------------------------------------------------- Multi-Disciplinary Care Plan Details Patient Name: Date of Service: Terri Burnett. 03/17/2022 3:00 PM Medical Record Number: [id number] Patient Account Number: [id number] Date of Birth/Sex: Treating RN: 05-11-1955 (67 y.o. Freddy Finner Primary Care Rhylee Pucillo: Beverely Low Other Clinician: Referring Chalisa Kobler: Treating Keshan Reha/Extender: Chauncey Mann, MICHA EL Vena [place] in Treatment: 1 [place], [place] Burnett ([id number]) [id number] Page 5 of 9 Active Inactive Abuse / Safety / Falls /  Self Care Management Nursing Diagnoses: Potential for injury related to falls Goals: Patient will not experience any injury related to falls Date Initiated: 03/07/2022 Target Resolution Date: 04/07/2022 Goal Status: Active Interventions: Assess Activities of Daily Living upon admission and as needed Assess fall risk on admission and as needed Assess: immobility, friction, shearing, incontinence upon admission and as needed Assess impairment of mobility on admission and as needed per policy Assess personal safety and home safety (as indicated) on admission and as needed Assess self care needs on admission and as needed Notes: Necrotic Tissue Nursing Diagnoses: Knowledge deficit related to management of necrotic/devitalized tissue Goals: Patient/caregiver will verbalize understanding of reason and process for debridement of necrotic tissue Date Initiated: 03/07/2022 Target Resolution Date: 04/07/2022 Goal Status: Active Interventions: Assess patient pain level pre-, during and post procedure and prior to  discharge Provide education on necrotic tissue and debridement process Notes: Wound/Skin Impairment Nursing Diagnoses: Knowledge deficit related to ulceration/compromised skin integrity Goals: Patient/caregiver will verbalize understanding of skin care regimen Date Initiated: 03/07/2022 Target Resolution Date: 04/07/2022 Goal Status: Active Ulcer/skin breakdown will have a volume reduction of 30% by week 4 Date Initiated: 03/07/2022 Target Resolution Date: 04/07/2022 Goal Status: Active Ulcer/skin breakdown will have a volume reduction of 50% by week 8 Date Initiated: 03/07/2022 Target Resolution Date: 05/06/2022 Goal Status: Active Ulcer/skin breakdown will have a volume reduction of 80% by week 12 Date Initiated: 03/07/2022 Target Resolution Date: 06/06/2022 Goal Status: Active Ulcer/skin breakdown will heal within 14 weeks Date Initiated: 03/07/2022 Target Resolution Date: 07/06/2022 Goal Status: Active Interventions: Assess patient/caregiver ability to obtain necessary supplies Assess patient/caregiver ability to perform ulcer/skin care regimen upon admission and as needed Assess ulceration(s) every visit Notes: Electronic Signature(s) Signed: 03/17/2022 4:03:24 PM By: Carlene Coria RN Entered By: Carlene Coria on 03/17/2022 15:15:46 Terri Burnett, Terri Burnett ([id number]) [id number] Page 6 of 9 -------------------------------------------------------------------------------- Pain Assessment Details Patient Name: Date of Service: Terri Burnett, Terri Burnett 03/17/2022 3:00 PM Medical Record Number: [id number] Patient Account Number: [id number] Date of Birth/Sex: Treating RN: 11-22-1955 (67 y.o. Orvan Falconer Primary Care Sherman Lipuma: Beverlyn Roux Other Clinician: Referring Ha Shannahan: Treating Amarah Brossman/Extender: RO BSO N, [place] EL Charna Busman Weeks in Treatment: 1 Active Problems Location of Pain Severity and Description of Pain Patient Has Paino No Site  Locations Pain Management and Medication Current Pain Management: Electronic Signature(s) Signed: 03/17/2022 4:03:24 PM By: Carlene Coria RN Entered By: Carlene Coria on 03/17/2022 15:10:56 -------------------------------------------------------------------------------- Patient/Caregiver Education Details Patient Name: Date of Service: Terri Burnett 1/25/2024andnbsp3:00 PM Medical Record Number: [id number] Patient Account Number: [id number] Date of Birth/Gender: Treating RN: 02/26/1955 (67 y.o. Orvan Falconer Primary Care Physician: Beverlyn Roux Other Clinician: Referring Physician: Treating Physician/Extender: Eldridge Dace, MICHA EL Denna Haggard in Treatment: [place], [place] ([id number]) [id number] Page 7 of 9 Education Assessment Education Provided To: Patient Education Topics Provided Wound/Skin Impairment: Methods: Explain/Verbal Responses: State content correctly Electronic Signature(s) Signed: 03/17/2022 4:03:24 PM By: Carlene Coria RN Entered By: Carlene Coria on 03/17/2022 15:15:41 -------------------------------------------------------------------------------- Wound Assessment Details Patient Name: Date of Service: Terri Burnett, Terri Burnett 03/17/2022 3:00 PM Medical Record Number: [id number] Patient Account Number: [id number] Date of Birth/Sex: Treating RN: 03/09/1955 (67 y.o. Orvan Falconer Primary Care Tallen Schnorr: Beverlyn Roux Other Clinician: Referring Jaymison Luber: Treating Rashaud Ybarbo/Extender: RO BSO N, MICHA EL Charna Busman Weeks in Treatment: 1 Wound Status Wound Number: 1 Primary Etiology: Dehisced Wound Wound Location: Right, Lateral Ankle Wound Status: Open Wounding Event: Surgical Injury Comorbid History: Hypertension, Type II Diabetes  Date Acquired: 01/25/2022 Weeks Of Treatment: 1 Clustered Wound: No Photos Wound Measurements Length: (cm) 1 Width: (cm) 0.9 Depth: (cm) 0.1 Area: (cm) 0.707 Volume:  (cm) 0.071 % Reduction in Area: 24.9% % Reduction in Volume: 81.2% Epithelialization: None Tunneling: No Undermining: No Wound Description Classification: Full Thickness Without Exposed Support Exudate Amount: Medium Exudate Type: Serosanguineous Exudate Color: red, brown Terri Burnett, Terri Burnett ([id number]) Structures Foul Odor After Cleansing: No Slough/Fibrino Yes [phone].pdf Page 8 of 9 Wound Bed Granulation Amount: Large (67-100%) Exposed Structure Granulation Quality: Hyper-granulation Fascia Exposed: No Necrotic Amount: Small (1-33%) Fat Layer (Subcutaneous Tissue) Exposed: No Necrotic Quality: Adherent Slough Tendon Exposed: No Muscle Exposed: No Joint Exposed: No Bone Exposed: No Treatment Notes Wound #1 (Ankle) Wound Laterality: Right, Lateral Cleanser Soap and Water Discharge Instruction: Gently cleanse wound with antibacterial soap, rinse and pat dry prior to dressing wounds Peri-Wound Care Topical Primary Dressing Prisma 4.34 (in) Discharge Instruction: Moisten w/normal saline or sterile water; Cover wound as directed. Do not remove from wound bed. Secondary Dressing (BORDER) Zetuvit Plus SILICONE BORDER Dressing 4x4 (in/in) Discharge Instruction: Please do not put silicone bordered dressings under wraps. Use non-bordered dressing only. Secured With Tubigrip Size E, 3.5x10 (in/yds) Discharge Instruction: Apply 3 Tubigrip E 3-finger-widths below knee to base of toes to secure dressing and/or for swelling. Compression Wrap Compression Stockings Add-Ons Electronic Signature(s) Signed: 03/17/2022 4:03:24 PM By: Carlene Coria RN Entered By: Carlene Coria on 03/17/2022 15:15:12 -------------------------------------------------------------------------------- Vitals Details Patient Name: Date of Service: Terri Burnett. 03/17/2022 3:00 PM Medical Record Number: [id number] Patient Account Number: [id number] Date of Birth/Sex:  Treating RN: 1955-08-28 (67 y.o. Orvan Falconer Primary Care Charistopher Rumble: Beverlyn Roux Other Clinician: Referring Broghan Pannone: Treating Kaytlin Burklow/Extender: RO BSO N, MICHA EL Charna Busman Weeks in Treatment: 1 Vital Signs Time Taken: 15:10 Temperature (F): 98.4 Height (in): 67 Pulse (bpm): 79 Weight (lbs): 205 Respiratory Rate (breaths/min): 18 Body Mass Index (BMI): 32.1 Blood Pressure (mmHg): 144/75 Reference Range: 80 - 120 mg / dl Terri Burnett, Terri Burnett ([id number]) [id number].pdf Page 9 of 9 Electronic Signature(s) Signed: 03/17/2022 4:03:24 PM By: Carlene Coria RN Entered By: Carlene Coria on 03/17/2022 15:10:50

## 2022-03-22 ENCOUNTER — Ambulatory Visit (INDEPENDENT_AMBULATORY_CARE_PROVIDER_SITE_OTHER): Payer: 59 | Admitting: Podiatry

## 2022-03-22 ENCOUNTER — Ambulatory Visit (INDEPENDENT_AMBULATORY_CARE_PROVIDER_SITE_OTHER): Payer: 59

## 2022-03-22 DIAGNOSIS — S82891A Other fracture of right lower leg, initial encounter for closed fracture: Secondary | ICD-10-CM

## 2022-03-22 DIAGNOSIS — Z9889 Other specified postprocedural states: Secondary | ICD-10-CM

## 2022-03-22 MED ORDER — OXYCODONE-ACETAMINOPHEN 5-325 MG PO TABS: 1.0000 | ORAL_TABLET | ORAL | 0 refills | Status: AC | PRN

## 2022-03-23 ENCOUNTER — Ambulatory Visit (INDEPENDENT_AMBULATORY_CARE_PROVIDER_SITE_OTHER): Payer: 59

## 2022-03-23 DIAGNOSIS — L97321 Non-pressure chronic ulcer of left ankle limited to breakdown of skin: Secondary | ICD-10-CM

## 2022-03-23 DIAGNOSIS — T8131XA Disruption of external operation (surgical) wound, not elsewhere classified, initial encounter: Secondary | ICD-10-CM

## 2022-03-24 ENCOUNTER — Ambulatory Visit: Payer: 59 | Admitting: Physician Assistant

## 2022-03-24 ENCOUNTER — Telehealth: Payer: Self-pay | Admitting: Podiatry

## 2022-03-24 NOTE — Telephone Encounter (Signed)
Pts daughter wants to go over instructions on how to change pts wound. States that the wound is now closed. She wants to know how to care for the wound, she wasn't able to come in which patient on Tuesday.  Please advise.

## 2022-03-30 NOTE — Progress Notes (Signed)
Subjective:  Patient ID: Terri Burnett, female    DOB: [birth year],  MRN: [id number]  Chief Complaint  Patient presents with   Routine Post Op    DOS: 01/04/2022 Procedure: Right ORIF of ankle fracture with syndesmotic repair  67 y.o. female returns for post-op check.  Patient states she is doing okay.  She is having pain.  She is doing well.  The wound has improved considerably.  Denies any other acute complaints.  Review of Systems: Negative except as noted in the HPI. Denies N/V/F/Ch.  Past Medical History:  Diagnosis Date   Diabetes mellitus without complication ([facility])    Hypertension    Stroke [facility]     Current Outpatient Medications:    oxyCODONE-acetaminophen (PERCOCET) 5-325 MG tablet, Take 1 tablet by mouth every 4 (four) hours as needed for severe pain., Disp: 30 tablet, Rfl: 0   amLODipine (NORVASC) 10 MG tablet, Take 10 mg by mouth daily., Disp: , Rfl:    atorvastatin (LIPITOR) 40 MG tablet, Take 40 mg by mouth at bedtime. , Disp: , Rfl:    clopidogrel (PLAVIX) 75 MG tablet, Take 75 mg by mouth daily., Disp: , Rfl:    ibuprofen (ADVIL) 800 MG tablet, Take 1 tablet (800 mg total) by mouth every 6 (six) hours as needed., Disp: 60 tablet, Rfl: 1   lacosamide (VIMPAT) 50 MG TABS tablet, Take 1 tablet (50 mg total) by mouth 2 (two) times daily., Disp: 60 tablet, Rfl: 1   losartan (COZAAR) 100 MG tablet, Take 100 mg by mouth daily., Disp: , Rfl:    metFORMIN (GLUCOPHAGE-XR) 500 MG 24 hr tablet, Take 500 mg by mouth daily with breakfast., Disp: , Rfl:    oxyCODONE (ROXICODONE) 5 MG immediate release tablet, Take 1 tablet (5 mg total) by mouth every 6 (six) hours as needed for severe pain., Disp: 20 tablet, Rfl: 0   oxyCODONE-acetaminophen (PERCOCET) 5-325 MG tablet, Take 1 tablet by mouth every 4 (four) hours as needed for severe pain., Disp: 30 tablet, Rfl: 0   oxyCODONE-acetaminophen (PERCOCET) 5-325 MG tablet, Take 1 tablet by mouth every 4 (four) hours as needed for  severe pain., Disp: 30 tablet, Rfl: 0   oxyCODONE-acetaminophen (PERCOCET) 5-325 MG tablet, Take 1 tablet by mouth every 4 (four) hours as needed for severe pain., Disp: 30 tablet, Rfl: 0   oxyCODONE-acetaminophen (PERCOCET) 5-325 MG tablet, Take 1 tablet by mouth every 4 (four) hours as needed for severe pain., Disp: 30 tablet, Rfl: 0  Social History   Tobacco Use  Smoking Status Former   Packs/day: 1.00   Years: 34.00   Total pack years: 34.00   Types: Cigarettes   Quit date: 2009   Years since quitting: 15.1  Smokeless Tobacco Never    No Known Allergies Objective:  There were no vitals filed for this visit. There is no height or weight on file to calculate BMI. Constitutional Well developed. Well nourished.  Vascular Foot warm and well perfused. Capillary refill normal to all digits.   Neurologic Normal speech. Oriented to person, place, and time. Epicritic sensation to light touch grossly present bilaterally.  Dermatologic Skin is healing.  Wound dehiscence completely reepithelialized except for small area.  Wound measurements are listed below.  No erythema noted.  No probing down to bone noted no hardware exposure noted.  Orthopedic: Tenderness to palpation noted about the surgical site.   Radiographs: 3 views of skeletally mature the right foot: Hardware is intact no signs of backing out or  loosening noted.  Reduction of syndesmosis noted.  Hardware is intact.  Good consolidation noted noted across the fracture site. Assessment:   1. Closed fracture of right ankle, initial encounter      Plan:  Patient was evaluated and treated and all questions answered.  S/p foot surgery right -Progressing as expected post-operatively. -XR: See above -WB Status: Nonweightbearing to the right lower extremity in wheelchair -Sutures: Considerably decreased.  Measuring 0.6 cm x 0.3 cm x 0.2 cm. -Medications: None -Continue doxycycline until improvement -Continue appointment at the  wound care center until completely closed. -She can become weightbearing as tolerated in a regular shoes.  No follow-ups on file.

## 2022-04-01 ENCOUNTER — Encounter: Payer: 59 | Attending: Physician Assistant | Admitting: Physician Assistant

## 2022-04-01 DIAGNOSIS — G4089 Other seizures: Secondary | ICD-10-CM | POA: Diagnosis not present

## 2022-04-01 DIAGNOSIS — I69351 Hemiplegia and hemiparesis following cerebral infarction affecting right dominant side: Secondary | ICD-10-CM | POA: Diagnosis not present

## 2022-04-01 DIAGNOSIS — X58XXXA Exposure to other specified factors, initial encounter: Secondary | ICD-10-CM | POA: Insufficient documentation

## 2022-04-01 DIAGNOSIS — I10 Essential (primary) hypertension: Secondary | ICD-10-CM | POA: Insufficient documentation

## 2022-04-01 DIAGNOSIS — E11622 Type 2 diabetes mellitus with other skin ulcer: Secondary | ICD-10-CM | POA: Diagnosis not present

## 2022-04-01 DIAGNOSIS — T8131XA Disruption of external operation (surgical) wound, not elsewhere classified, initial encounter: Secondary | ICD-10-CM | POA: Insufficient documentation

## 2022-04-01 DIAGNOSIS — L97312 Non-pressure chronic ulcer of right ankle with fat layer exposed: Secondary | ICD-10-CM | POA: Insufficient documentation

## 2022-04-01 LAB — VAS US ABI WITH/WO TBI
Left ABI: 1.04
Right ABI: 1.06

## 2022-04-01 NOTE — Progress Notes (Signed)
PAYTTON, SNEARY [id number] ([id number]) [id number] Page 1 of 7 Visit Report for 04/01/2022 Chief Complaint Document Details Patient Name: Date of Service: Terri Burnett, Terri Burnett. 04/01/2022 10:00 A M Medical Record Number: [id number] Patient Account Number: [id number] Date of Birth/Sex: Treating RN: Sep 26, 1955 (67 y.o. Drema Pry Primary Care Provider: Beverlyn Roux Other Clinician: Referring Provider: Treating Provider/Extender: Lockie Pares Weeks in Treatment: 3 Information Obtained from: Patient Chief Complaint Right lateral ankle ulcer Electronic Signature(s) Signed: 04/01/2022 2:35:56 PM By: Worthy Keeler PA-C Signed: 04/04/2022 11:05:12 AM By: Rosalio Loud MSN RN CNS WTA Previous Signature: 04/01/2022 10:06:25 AM Version By: Worthy Keeler PA-C Entered By: Rosalio Loud on 04/01/2022 10:42:37 -------------------------------------------------------------------------------- Debridement Details Patient Name: Date of Service: Terri Burnett. 04/01/2022 10:00 A M Medical Record Number: [id number] Patient Account Number: [id number] Date of Birth/Sex: Treating RN: 06/21/1955 (67 y.o. Drema Pry Primary Care Provider: Beverlyn Roux Other Clinician: Referring Provider: Treating Provider/Extender: Lockie Pares Weeks in Treatment: 3 Debridement Performed for Assessment: Wound #1 Right,Lateral Ankle Performed By: Physician Tommie Sams., PA-C Debridement Type: Debridement Level of Consciousness (Pre-procedure): Awake and Alert Pre-procedure Verification/Time Out Yes - 10:39 Taken: Start Time: 10:39 Pain Control: Lidocaine 4% T opical Solution T Area Debrided (L x W): otal 0.3 (cm) x 0.3 (cm) = 0.09 (cm) Tissue and other material debrided: Viable, Non-Viable, Slough, Subcutaneous, Skin: Dermis , Skin: Epidermis, Slough Level: Skin/Subcutaneous Tissue Debridement Description: Excisional Instrument: Curette Bleeding:  Minimum Hemostasis Achieved: Pressure Response to Treatment: Procedure was tolerated well Level of Consciousness (Post- Awake and Alert IVANKA, COLESON ([id number]) [id number] Page 2 of 7 Awake and Alert procedure): Post Debridement Measurements of Total Wound Length: (cm) 0.3 Width: (cm) 0.3 Depth: (cm) 0.4 Volume: (cm) 0.028 Character of Wound/Ulcer Post Debridement: Stable Post Procedure Diagnosis Same as Pre-procedure Electronic Signature(s) Signed: 04/01/2022 2:35:56 PM By: Worthy Keeler PA-C Signed: 04/04/2022 11:05:12 AM By: Rosalio Loud MSN RN CNS WTA Entered By: Rosalio Loud on 04/01/2022 10:40:53 -------------------------------------------------------------------------------- HPI Details Patient Name: Date of Service: Terri Burnett. 04/01/2022 10:00 A M Medical Record Number: [id number] Patient Account Number: [id number] Date of Birth/Sex: Treating RN: 20-Jan-1956 (67 y.o. Drema Pry Primary Care Provider: Beverlyn Roux Other Clinician: Referring Provider: Treating Provider/Extender: Lockie Pares Weeks in Treatment: 3 History of Present Illness HPI Description: 03-07-2022 upon evaluation today patient presents for initial inspection here in our clinic concerning issues she has been having with a wound on the right lateral ankle after she had a fall with ankle fracture on the beginning part of November to middle 2023. She tells me she was hospitalized on November 14 where she did have internal fixation of the ankle fracture. With that being said unfortunately she developed an issue following the discharge where the wound did not heal when the staples were eventually removed this was still not closed and dehisced to some degree. Fortunately there does not appear to be any evidence of infection locally nor systemically which is great news. No fevers, chills, nausea, vomiting, or diarrhea. Patient does have a history  of diabetes mellitus type 2, hypertension, stroke where she does still have some right-sided weakness noted currently, and again a history of seizures though she is not sure exactly why this happened as of yet. 1/25; right lateral ankle wound in the mid part of his surgical incision from a fracture that was initially obtained in November with wound dehiscence in December. We used silver collagen on this last week they  are changing it every other day. She arrived in clinic today with some unusual probing hyper granulated tissue. This was nonadherent to the sides of the wound. Apparently quite a bit different from 10 days ago 04-01-2022 upon evaluation today patient appears to be doing well currently in regard to her wound which is actually significantly smaller compared to last time I saw her. There is still a very small opening but nothing anywhere like what was originally seen. Overall I am extremely pleased with where we stand today. Electronic Signature(s) Signed: 04/01/2022 1:57:15 PM By: Worthy Keeler PA-C Entered By: Worthy Keeler on 04/01/2022 13:57:14 Haberman, Manvir M ([id number] Page 3 of 7 -------------------------------------------------------------------------------- Physical Exam Details Patient Name: Date of Service: Terri Burnett, Terri Burnett 04/01/2022 10:00 A M Medical Record Number: [id number] Patient Account Number: [id number] Date of Birth/Sex: Treating RN: [birth year] (67 y.o. Drema Pry Primary Care Provider: Beverlyn Roux Other Clinician: Referring Provider: Treating Provider/Extender: Lockie Pares Weeks in Treatment: 3 Constitutional Well-nourished and well-hydrated in no acute distress. Respiratory normal breathing without difficulty. Psychiatric this patient is able to make decisions and demonstrates good insight into disease process. Alert and Oriented x 3. pleasant and cooperative. Notes Upon  inspection patient's wound bed actually showed signs of good granulation epithelization there was some need for sharp debridement which I performed today to clearway some of the necrotic debris she tolerated that without complication and postdebridement the wound bed is significantly improved. Electronic Signature(s) Signed: 04/01/2022 1:57:52 PM By: Worthy Keeler PA-C Entered By: Worthy Keeler on 04/01/2022 13:57:52 -------------------------------------------------------------------------------- Physician Orders Details Patient Name: Date of Service: Terri Burnett. 04/01/2022 10:00 A M Medical Record Number: [id number] Patient Account Number: [id number] Date of Birth/Sex: Treating RN: February 15, 1956 (67 y.o. 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 Bathing/ Shower/ Hygiene May shower with wound dressing protected with water repellent cover or cast protector. No tub bath. Anesthetic (Use 'Patient Medications' Section for Anesthetic Order Entry) Lidocaine applied to wound bed Edema Control - Lymphedema / Segmental Compressive Device / Other Elevate, Exercise Daily and A void Standing for Long Periods of Time. Elevate legs to the level of the heart and pump ankles  as often as possible Elevate leg(s) parallel to the floor when sitting. Off-Loading Open toe surgical shoe Wound Treatment Wound #1 - Ankle Wound Laterality: Right, Lateral Cleanser: Soap and Water Every Other Day/30 Days Discharge Instructions: Gently cleanse wound with antibacterial soap, rinse and pat dry prior to dressing wounds Prim Dressing: Prisma 4.34 (in) (Generic) Every Other Day/30 Days ary Discharge Instructions: Moisten w/normal saline or sterile water; Cover wound as directed. Do not remove from wound bed. Secondary Dressing: (BORDER) Zetuvit Plus SILICONE BORDER Dressing 4x4 (in/in) (Generic) Every Other Day/30 Days Discharge Instructions: Please do not put silicone bordered dressings under wraps. Use non-bordered dressing only. Secured With: Tubigrip Size E, 3.5x10 (in/yds) Every Other Day/30 Days Discharge Instructions: Apply 3 Tubigrip E 3-finger-widths below knee to  base of toes to secure dressing and/or for swelling. Electronic Signature(s) Signed: 04/01/2022 2:35:56 PM By: Worthy Keeler PA-C Signed: 04/04/2022 11:05:12 AM By: Rosalio Loud MSN RN CNS WTA Entered By: Rosalio Loud on 04/01/2022 10:41:32 -------------------------------------------------------------------------------- Problem List Details Patient Name: Date of Service: Terri Burnett. 04/01/2022 10:00 A M Medical Record Number: [id number] Patient Account Number: [id number] Date of Birth/Sex: Treating RN: [birth year] (67 y.o. Drema Pry Primary Care Provider: Beverlyn Roux Other Clinician: Referring Provider: Treating Provider/Extender: Lockie Pares Weeks in Treatment: 3 Active Problems ICD-10 Encounter Code Description Active Date MDM Diagnosis T81.31XA Disruption of external operation (surgical) wound, not elsewhere classified, 03/07/2022 No Yes initial encounter L97.312 Non-pressure chronic ulcer of right ankle with fat layer exposed 03/07/2022 No Yes Terri Burnett, Terri Burnett  ([id number]) [id number] Page 5 of 7 E11.622 Type 2 diabetes mellitus with other skin ulcer 03/07/2022 No Yes I10 Essential (primary) hypertension 03/07/2022 No Yes I69.351 Hemiplegia and hemiparesis following cerebral infarction affecting right 03/07/2022 No Yes dominant side G40.89 Other seizures 03/07/2022 No Yes Inactive Problems Resolved Problems Electronic Signature(s) Signed: 04/01/2022 2:35:56 PM By: Worthy Keeler PA-C Signed: 04/04/2022 11:05:12 AM By: Rosalio Loud MSN RN CNS WTA Previous Signature: 04/01/2022 10:06:16 AM Version By: Worthy Keeler PA-C Entered By: Rosalio Loud on 04/01/2022 10:42:30 -------------------------------------------------------------------------------- Progress Note Details Patient Name: Date of Service: Terri Burnett. 04/01/2022 10:00 A M Medical Record Number: [id number] Patient Account Number: [id number] Date of Birth/Sex: Treating RN: Aug 20, 1955 (67 y.o. Drema Pry Primary Care Provider: Beverlyn Roux Other Clinician: Referring Provider: Treating Provider/Extender: Lockie Pares Weeks in Treatment: 3 Subjective Chief Complaint Information obtained from Patient Right lateral ankle ulcer History of Present Illness (HPI) 03-07-2022 upon evaluation today patient presents for initial inspection here in our clinic concerning issues she has been having with a wound on the right lateral ankle after she had a fall with ankle fracture on the beginning part of November to middle 2023. She tells me she was hospitalized on November 14 where she did have internal fixation of the ankle fracture. With that being said unfortunately she developed an issue following the discharge where the wound did not heal when the staples were eventually removed this was still not closed and dehisced to some degree. Fortunately there does not appear to be any evidence of infection locally nor systemically which is great news. No  fevers, chills, nausea, vomiting, or diarrhea. Patient does have a history of diabetes mellitus type 2, hypertension, stroke where she does still have some right-sided weakness noted currently, and again a history of seizures though she is not sure exactly why this happened as of yet. 1/25; right lateral ankle wound in the mid part of his surgical incision from a fracture that was initially obtained in November with wound dehiscence in December. We used silver collagen on this last week they are changing it every other day. She arrived in clinic today with some unusual probing hyper granulated tissue. This was nonadherent to the sides of the wound. Apparently quite a bit different from 10 days ago 04-01-2022 upon evaluation today patient appears to be doing well currently in regard to her wound which is actually significantly smaller compared to last time I saw her. There is still a very small opening but nothing anywhere like what was originally seen. Overall I am extremely pleased with where we stand today. Terri Burnett, Terri Burnett [id number] ([id number]) [id number] Page 6 of 7 Objective Constitutional Well-nourished and well-hydrated in no acute distress.  Vitals Time Taken: 10:28 AM, Height: 67 in, Weight: 205 lbs, BMI: 32.1, Temperature: 98.1 F, Pulse: 80 bpm, Respiratory Rate: 18 breaths/min, Blood Pressure: 143/90 mmHg. Respiratory normal breathing without difficulty. Psychiatric this patient is able to make decisions and demonstrates good insight into disease process. Alert and Oriented x 3. pleasant and cooperative. General Notes: Upon inspection patient's wound bed actually showed signs of good granulation epithelization there was some need for sharp debridement which I performed today to clearway some of the necrotic debris she tolerated that without complication and postdebridement the wound bed is significantly improved. Integumentary (Hair, Skin) Wound #1 status  is Open. Original cause of wound was Surgical Injury. The date acquired was: 01/25/2022. The wound has been in treatment 3 weeks. The wound is located on the Right,Lateral Ankle. The wound measures 0.3cm length x 0.3cm width x 0.3cm depth; 0.071cm^2 area and 0.021cm^3 volume. There is a medium amount of serosanguineous drainage noted. There is large (67-100%) hyper - granulation within the wound bed. There is a small (1-33%) amount of necrotic tissue within the wound bed including Adherent Slough. Assessment Active Problems ICD-10 Disruption of external operation (surgical) wound, not elsewhere classified, initial encounter Non-pressure chronic ulcer of right ankle with fat layer exposed Type 2 diabetes mellitus with other skin ulcer Essential (primary) hypertension Hemiplegia and hemiparesis following cerebral infarction affecting right dominant side Other seizures Procedures Wound #1 Pre-procedure diagnosis of Wound #1 is a Dehisced Wound located on the Right,Lateral Ankle . There was a Excisional Skin/Subcutaneous Tissue Debridement with a total area of 0.09 sq cm performed by Tommie Sams., PA-C. With the following instrument(s): Curette to remove Viable and Non-Viable tissue/material. Material removed includes Subcutaneous Tissue, Slough, Skin: Dermis, and Skin: Epidermis after achieving pain control using Lidocaine 4% T opical Solution. No specimens were taken. A time out was conducted at 10:39, prior to the start of the procedure. A Minimum amount of bleeding was controlled with Pressure. The procedure was tolerated well. Post Debridement Measurements: 0.3cm length x 0.3cm width x 0.4cm depth; 0.028cm^3 volume. Character of Wound/Ulcer Post Debridement is stable. Post procedure Diagnosis Wound #1: Same as Pre-Procedure Plan Follow-up Appointments: Return Appointment in 1 week. Bathing/ Shower/ Hygiene: May shower with wound dressing protected with water repellent cover or cast  protector. No tub bath. Anesthetic (Use 'Patient Medications' Section for Anesthetic Order Entry): Lidocaine applied to wound bed Edema Control - Lymphedema / Segmental Compressive Device / Other: Elevate, Exercise Daily and Avoid Standing for Long Periods of Time. Elevate legs to the level of the heart and pump ankles as often as possible Elevate leg(s) parallel to the floor when sitting. Off-Loading: Open toe surgical shoe WOUND #1: - Ankle Wound Laterality: Right, Lateral Cleanser: Soap and Water Every Other Day/30 Days Discharge Instructions: Gently cleanse wound with antibacterial soap, rinse and pat dry prior to dressing wounds Prim Dressing: Prisma 4.34 (in) (Generic) Every Other Day/30 Days ary Discharge Instructions: Moisten w/normal saline or sterile water; Cover wound as directed. Do not remove from wound bed. Secondary Dressing: (BORDER) Zetuvit Plus SILICONE BORDER Dressing 4x4 (in/in) (Generic) Every Other Day/30 Days Terri Burnett, Terri Burnett ([id number]) [id number] Page 7 of 7 Discharge Instructions: Please do not put silicone bordered dressings under wraps. Use non-bordered dressing only. Secured With: Tubigrip Size E, 3.5x10 (in/yds) Every Other Day/30 Days Discharge Instructions: Apply 3 Tubigrip E 3-finger-widths below knee to base of toes to secure dressing and/or for swelling. 1. I am going to suggest that we have the patient  continue to as much as possible elevate her legs to keep the edema under good control I think this is good to be ongoing still a absolute necessity. 2. I am also can recommend that the patient should continue to utilize the compression wrapping which I think is doing a really good job here as well. 3. I am also going to suggest that the patient continue to monitor for any infection though honestly I think she is really doing quite well. We are using Tubigrip at this point. We will see patient back for reevaluation in 1  week here in the clinic. If anything worsens or changes patient will contact our office for additional recommendations. Electronic Signature(s) Signed: 04/01/2022 1:58:16 PM By: Worthy Keeler PA-C Entered By: Worthy Keeler on 04/01/2022 13:58:15 -------------------------------------------------------------------------------- SuperBill Details Patient Name: Date of Service: Terri Burnett 04/01/2022 Medical Record Number: [id number] Patient Account Number: [id number] Date of Birth/Sex: Treating RN: 02/28/1955 (67 y.o. 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

## 2022-04-01 NOTE — Progress Notes (Signed)
MARQUISA, DIMANCHE [id number] ([id number]) [id number] Page 1 of 8 Visit Report for 04/01/2022 Arrival Information Details Patient Name: Date of Service: Terri Burnett, Terri Burnett. 04/01/2022 10:00 A M Medical Record Number: [id number] Patient Account Number: [id number] Date of Birth/Sex: Treating RN: 10-07-1955 (67 y.o. Drema Pry Primary Care Pollyann Roa: Beverlyn Roux Other Clinician: Referring Beckham Capistran: Treating Ladarren Steiner/Extender: Lockie Pares Weeks in Treatment: 3 Visit Information History Since Last Visit Added or deleted any medications: No Patient Arrived: Wheel Chair Any new allergies or adverse reactions: No Arrival Time: 10:21 Had a fall or experienced change in No Accompanied By: daughter activities of daily living that may affect Transfer Assistance: None risk of falls: Patient Identification Verified: Yes Hospitalized since last visit: No Secondary Verification Process Completed: Yes Has Dressing in Place as Prescribed: Yes Patient Requires Transmission-Based Precautions: No Pain Present Now: No Patient Has Alerts: Yes Patient Alerts: Patient on Blood Thinner DIABETIC Electronic Signature(s) Unsigned Entered By: Rosalio Loud on 04/01/2022 10:28:03 -------------------------------------------------------------------------------- Clinic Level of Care Assessment Details Patient Name: Date of Service: Terri Burnett, Terri Burnett 04/01/2022 10:00 A M Medical Record Number: [id number] Patient Account Number: [id number] Date of Birth/Sex: Treating RN: 10/22/1955 (67 y.o. Drema Pry Primary Care [place]: Beverlyn Roux Other Clinician: Referring Vianey Caniglia: Treating Kendahl Bumgardner/Extender: Lockie Pares Weeks in Treatment: 3 Clinic Level of Care Assessment Items TOOL 1 Quantity Score []$  - 0 Use when EandM and Procedure is performed on INITIAL visit ASSESSMENTS - Nursing Assessment / Reassessment []$  - 0 General Physical Exam  (combine w/ comprehensive assessment (listed just below) when performed on new pt. evals) []$  - 0 Comprehensive Assessment (HX, ROS, Risk Assessments, Wounds Hx, etc.) ASSESSMENTS - Wound and Skin Assessment / Reassessment ROSEZELLA, BARMES ([id number]) [id number] Page 2 of 8 []$  - 0 Dermatologic / Skin Assessment (not related to wound area) ASSESSMENTS - Ostomy and/or Continence Assessment and Care []$  - 0 Incontinence Assessment and Management []$  - 0 Ostomy Care Assessment and Management (repouching, etc.) PROCESS - Coordination of Care []$  - 0 Simple Patient / Family Education for ongoing care []$  - 0 Complex (extensive) Patient / Family Education for ongoing care []$  - 0 Staff obtains Programmer, systems, Records, T Results / Process Orders est []$  - 0 Staff telephones HHA, Nursing Homes / Clarify orders / etc []$  - 0 Routine Transfer to another Facility (non-emergent condition) []$  - 0 Routine Hospital Admission (non-emergent condition) []$  - 0 New Admissions / Biomedical engineer / Ordering NPWT Apligraf, etc. , []$  - 0 Emergency Hospital Admission (emergent condition) PROCESS - Special Needs []$  - 0 Pediatric / Minor Patient Management []$  - 0 Isolation Patient Management []$  - 0 Hearing / Language / Visual special needs []$  - 0 Assessment of Community assistance (transportation, D/C planning, etc.) []$  - 0 Additional assistance / Altered mentation []$  - 0 Support Surface(s) Assessment (bed, cushion, seat, etc.) INTERVENTIONS - Miscellaneous []$  - 0 External ear exam []$  - 0 Patient Transfer (multiple staff / Civil Service fast streamer / Similar devices) []$  - 0 Simple Staple / Suture removal (25 or less) []$  - 0 Complex Staple / Suture removal (26 or more) []$  - 0 Hypo/Hyperglycemic Management (do not check if billed separately) []$  - 0 Ankle / Brachial Index (ABI) - do not check if billed separately Has the patient been seen at the hospital within the last three  years: Yes Total Score: 0 Level Of Care: ____ Electronic Signature(s) Unsigned Entered By: Rosalio Loud on 04/01/2022 10:41:39 -------------------------------------------------------------------------------- Encounter Discharge Information Details Patient Name: Date  of Service: Terri Burnett, Terri Burnett 04/01/2022 10:00 A M Medical Record Number: [id number] Patient Account Number: [id number] Date of Birth/Sex: Treating RN: 04-22-1955 (67 y.o. Drema Pry Primary Care Kessler Kopinski: Beverlyn Roux Other Clinician: Referring Donaldo Teegarden: Treating Dorraine Ellender/Extender: Lockie Pares [place], [place] ([id number]) [id number] Page 3 of 8 Weeks in Treatment: 3 Encounter Discharge Information Items Post Procedure Vitals Discharge Condition: Stable Temperature (F): 98.1 Ambulatory Status: Wheelchair Pulse (bpm): 80 Discharge Destination: Home Respiratory Rate (breaths/min): 16 Transportation: Private Auto Blood Pressure (mmHg): 143/90 Accompanied By: daughter Schedule Follow-up Appointment: Yes Clinical Summary of Care: Electronic Signature(s) Unsigned Entered By: Rosalio Loud on 04/01/2022 10:43:28 -------------------------------------------------------------------------------- Lower Extremity Assessment Details Patient Name: Date of Service: Terri Burnett, Terri Burnett 04/01/2022 10:00 A M Medical Record Number: [id number] Patient Account Number: [id number] Date of Birth/Sex: Treating RN: 02/26/1955 (67 y.o. Drema Pry Primary Care Erial Fikes: Beverlyn Roux Other Clinician: Referring Ladona Rosten: Treating Saulo Anthis/Extender: Lockie Pares Weeks in Treatment: 3 Electronic Signature(s) Unsigned Entered By: Rosalio Loud on 04/01/2022 10:34:14 -------------------------------------------------------------------------------- Multi Wound Chart Details Patient Name: Date of Service: Terri Burnett, Terri Burnett. 04/01/2022 10:00 A M Medical Record  Number: [id number] Patient Account Number: [id number] Date of Birth/Sex: Treating RN: [birth year] (67 y.o. Drema Pry Primary Care Wilene Pharo: Beverlyn Roux Other Clinician: Referring Oneita Allmon: Treating Jordana Dugue/Extender: Lockie Pares Weeks in Treatment: 3 Vital Signs Height(in): 67 Pulse(bpm): 80 Weight(lbs): 205 Blood Pressure(mmHg): 143/90 Body Mass Index(BMI): 32.1 Temperature(F): 98.1 ROZELL, SAMMET M ([id number]) [id number] Page 4 of 8 Respiratory Rate(breaths/min): 18 [1:Photos:] [N/A:N/A] Right, Lateral Ankle N/A N/A Wound Location: Surgical Injury N/A N/A Wounding Event: Dehisced Wound N/A N/A Primary Etiology: Hypertension, Type II Diabetes N/A N/A Comorbid History: 01/25/2022 N/A N/A Date Acquired: 3 N/A N/A Weeks of Treatment: Open N/A N/A Wound Status: No N/A N/A Wound Recurrence: 0.3x0.3x0.3 N/A N/A Measurements L x W x D (cm) 0.071 N/A N/A A (cm) : rea 0.021 N/A N/A Volume (cm) : 92.50% N/A N/A % Reduction in A rea: 94.40% N/A N/A % Reduction in Volume: Full Thickness Without Exposed N/A N/A Classification: Support Structures Medium N/A N/A Exudate Amount: Serosanguineous N/A N/A Exudate Type: red, brown N/A N/A Exudate Color: Large (67-100%) N/A N/A Granulation Amount: Small (1-33%) N/A N/A Necrotic Amount: Fascia: No N/A N/A Exposed Structures: Fat Layer (Subcutaneous Tissue): No Tendon: No Muscle: No Joint: No Bone: No None N/A N/A Epithelialization: Treatment Notes Electronic Signature(s) Unsigned Entered By: Rosalio Loud on 04/01/2022 10:39:40 -------------------------------------------------------------------------------- Multi-Disciplinary Care Plan Details Patient Name: Date of Service: Terri Burnett, Terri Burnett. 04/01/2022 10:00 A M Medical Record Number: [id number] Patient Account Number: [id number] Date of Birth/Sex: Treating RN: 11-12-1955 (67 y.o. Drema Pry Primary Care Gerlad Pelzel: Beverlyn Roux Other Clinician: Referring Tzipora Mcinroy: Treating Roxy Filler/Extender: Lockie Pares Weeks in Treatment: 3 Active Inactive Necrotic Tissue Terri Burnett, Terri Burnett [id number] ([id number]) [id number] Page 5 of 8 Nursing Diagnoses: Knowledge deficit related to management of necrotic/devitalized tissue Goals: Patient/caregiver will verbalize understanding of reason and process for debridement of necrotic tissue Date Initiated: 03/07/2022 Target Resolution Date: 04/07/2022 Goal Status: Active Interventions: Assess patient pain level pre-, during and post procedure and prior to discharge Provide education on necrotic tissue and debridement process Notes: Wound/Skin Impairment Nursing Diagnoses: Knowledge deficit related to ulceration/compromised skin integrity Goals: Patient/caregiver will verbalize understanding of skin care regimen Date Initiated: 03/07/2022 Target Resolution Date: 04/07/2022 Goal Status: Active Ulcer/skin breakdown will have a volume reduction of 30% by week 4 Date Initiated: 03/07/2022 Target Resolution Date: 04/07/2022 Goal Status: Active Ulcer/skin breakdown  will have a volume reduction of 50% by week 8 Date Initiated: 03/07/2022 Target Resolution Date: 05/06/2022 Goal Status: Active Ulcer/skin breakdown will have a volume reduction of 80% by week 12 Date Initiated: 03/07/2022 Target Resolution Date: 06/06/2022 Goal Status: Active Ulcer/skin breakdown will heal within 14 weeks Date Initiated: 03/07/2022 Target Resolution Date: 07/06/2022 Goal Status: Active Interventions: Assess patient/caregiver ability to obtain necessary supplies Assess patient/caregiver ability to perform ulcer/skin care regimen upon admission and as needed Assess ulceration(s) every visit Notes: Electronic Signature(s) Unsigned Entered By: Rosalio Loud on 04/01/2022  10:42:16 -------------------------------------------------------------------------------- Pain Assessment Details Patient Name: Date of Service: Terri Burnett, Terri Burnett 04/01/2022 10:00 A M Medical Record Number: [id number] Patient Account Number: [id number] Date of Birth/Sex: Treating RN: 06/30/1955 (67 y.o. Drema Pry Primary Care Sayra Frisby: Beverlyn Roux Other Clinician: Referring Hasna Stefanik: Treating Cornel Werber/Extender: Lockie Pares Weeks in Treatment: 3 Active Problems Location of Pain Severity and Description of Pain Patient Has Paino No ANESIA, HUTHER [id number] ([id number]) [id number] Page 6 of 8 Patient Has Paino No Site Locations Pain Management and Medication Current Pain Management: Electronic Signature(s) Unsigned Entered By: Rosalio Loud on 04/01/2022 10:29:52 -------------------------------------------------------------------------------- Patient/Caregiver Education Details Patient Name: Date of Service: Terri Burnett, Terri M. 2/9/2024andnbsp10:00 A M Medical Record Number: [id number] Patient Account Number: [id number] Date of Birth/Gender: Treating RN: 06/05/1955 (67 y.o. Drema Pry Primary Care Physician: Beverlyn Roux Other Clinician: Referring Physician: Treating Physician/Extender: Cathie Olden in Treatment: 3 Education Assessment Education Provided To: Patient Education Topics Provided Wound Debridement: Handouts: Wound Debridement Methods: Explain/Verbal Responses: State content correctly Electronic Signature(s) Unsigned Entered By: Rosalio Loud on 04/01/2022 10:42:00 Signature(s): Pandora Leiter ([id number]) Date(s): [id number] Page 7 of 8 -------------------------------------------------------------------------------- Wound Assessment Details Patient Name: Date of Service: Terri Burnett, Terri Burnett. 04/01/2022 10:00 A M Medical Record Number:  [id number] Patient Account Number: [id number] Date of Birth/Sex: Treating RN: [birth year] (67 y.o. Drema Pry Primary Care Tivis Wherry: Beverlyn Roux Other Clinician: Referring Sylvestre Rathgeber: Treating Hogan Hoobler/Extender: Lockie Pares Weeks in Treatment: 3 Wound Status Wound Number: 1 Primary Etiology: Dehisced Wound Wound Location: Right, Lateral Ankle Wound Status: Open Wounding Event: Surgical Injury Comorbid History: Hypertension, Type II Diabetes Date Acquired: 01/25/2022 Weeks Of Treatment: 3 Clustered Wound: No Photos Wound Measurements Length: (cm) 0.3 Width: (cm) 0.3 Depth: (cm) 0.3 Area: (cm) 0.071 Volume: (cm) 0.021 % Reduction in Area: 92.5% % Reduction in Volume: 94.4% Epithelialization: None Wound Description Classification: Full Thickness Without Exposed Support Structures Exudate Amount: Medium Exudate Type: Serosanguineous Exudate Color: red, brown Foul Odor After Cleansing: No Slough/Fibrino Yes Wound Bed Granulation Amount: Large (67-100%) Exposed Structure Granulation Quality: Hyper-granulation Fascia Exposed: No Necrotic Amount: Small (1-33%) Fat Layer (Subcutaneous Tissue) Exposed: No Necrotic Quality: Adherent Slough Tendon Exposed: No Muscle Exposed: No Joint Exposed: No Bone Exposed: No Treatment Notes Wound #1 (Ankle) Wound Laterality: Right, Lateral Cleanser Soap and Water Discharge Instruction: Gently cleanse wound with antibacterial soap, rinse and pat dry prior to dressing wounds [place], [place] ([id number]) [id number] Page 8 of 8 Topical Primary Dressing Prisma 4.34 (in) Discharge Instruction: Moisten w/normal saline or sterile water; Cover wound as directed. Do not remove from wound bed. Secondary Dressing (BORDER) Zetuvit Plus SILICONE BORDER Dressing 4x4 (in/in) Discharge Instruction: Please do not put silicone bordered dressings under wraps. Use non-bordered dressing  only. Secured With Tubigrip Size E, 3.5x10 (in/yds) Discharge Instruction: Apply 3 Tubigrip E 3-finger-widths below knee to base of toes to secure dressing and/or for swelling. Compression Wrap Compression Stockings Add-Ons Electronic  Signature(s) Unsigned Entered By: Rosalio Loud on 04/01/2022 10:33:53 -------------------------------------------------------------------------------- Vitals Details Patient Name: Date of Service: Terri Burnett, Terri Burnett. 04/01/2022 10:00 A M Medical Record Number: [id number] Patient Account Number: [id number] Date of Birth/Sex: Treating RN: [birth year] (67 y.o. Drema Pry Primary Care Hewitt Garner: Beverlyn Roux Other Clinician: Referring Neyra Pettie: Treating Corwyn Vora/Extender: Lockie Pares Weeks in Treatment: 3 Vital Signs Time Taken: 10:28 Temperature (F): 98.1 Height (in): 67 Pulse (bpm): 80 Weight (lbs): 205 Respiratory Rate (breaths/min): 18 Body Mass Index (BMI): 32.1 Blood Pressure (mmHg): 143/90 Reference Range: 80 - 120 mg / dl Electronic Signature(s) Unsigned Entered By: Rosalio Loud on 04/01/2022 10:29:46 Signature(s): Date(s):

## 2022-04-11 ENCOUNTER — Encounter: Payer: 59 | Admitting: Physician Assistant

## 2022-04-11 DIAGNOSIS — T8131XA Disruption of external operation (surgical) wound, not elsewhere classified, initial encounter: Secondary | ICD-10-CM | POA: Diagnosis not present

## 2022-04-11 NOTE — Progress Notes (Addendum)
Terri Burnett, Terri Burnett [id number] ([id number]) [id number] Page 1 of 6 Visit Report for 04/11/2022 Chief Complaint Document Details Patient Name: Date of Service: Terri Burnett, Terri Burnett. 04/11/2022 8:30 A M Medical Record Number: [id number] Patient Account Number: [id number] Date of Birth/Sex: Treating RN: [birth year] (67 y.o. Terri Burnett Primary Care Provider: Beverlyn Roux Other Clinician: Referring Provider: Treating Provider/Extender: Lockie Pares Weeks in Treatment: 5 Information Obtained from: Patient Chief Complaint Right lateral ankle ulcer Electronic Signature(s) Signed: 04/11/2022 8:24:48 AM By: Worthy Keeler PA-C Entered By: Worthy Keeler on 04/11/2022 08:24:47 -------------------------------------------------------------------------------- HPI Details Patient Name: Date of Service: Terri Burnett. 04/11/2022 8:30 A M Medical Record Number: [id number] Patient Account Number: [id number] Date of Birth/Sex: Treating RN: [birth year] (67 y.o. Terri Burnett Primary Care Provider: Beverlyn Roux Other Clinician: Referring Provider: Treating Provider/Extender: Lockie Pares Weeks in Treatment: 5 History of Present Illness HPI Description: 03-07-2022 upon evaluation today patient presents for initial inspection here in our clinic concerning issues she has been having with a wound on the right lateral ankle after she had a fall with ankle fracture on the beginning part of November to middle 2023. She tells me she was hospitalized on November 14 where she did have internal fixation of the ankle fracture. With that being said unfortunately she developed an issue following the discharge where the wound did not heal when the staples were eventually removed this was still not closed and dehisced to some degree. Fortunately there does not appear to be any evidence of infection locally nor systemically which is great news. No fevers,  chills, nausea, vomiting, or diarrhea. Patient does have a history of diabetes mellitus type 2, hypertension, stroke where she does still have some right-sided weakness noted currently, and again a history of seizures though she is not sure exactly why this happened as of yet. 1/25; right lateral ankle wound in the mid part of his surgical incision from a fracture that was initially obtained in November with wound dehiscence in December. We used silver collagen on this last week they are changing it every other day. She arrived in clinic today with some unusual probing hyper granulated tissue. This was nonadherent to the sides of the wound. Apparently quite a bit different from 10 days ago 04-01-2022 upon evaluation today patient appears to be doing well currently in regard to her wound which is actually significantly smaller compared to last time I saw her. There is still a very small opening but nothing anywhere like what was originally seen. Overall I am extremely pleased with where we stand today. Terri Burnett, Terri Burnett [id number] ([id number]) [id number] Page 2 of 6 04-11-2022 upon evaluation today patient appears to be doing well currently in regard to her wound in fact this appears to be completely healed based on what I am seeing. Fortunately there does not appear to be any signs of active infection locally nor systemically at this time which is great news. Electronic Signature(s) Signed: 04/11/2022 9:08:09 AM By: Worthy Keeler PA-C Entered By: Worthy Keeler on 04/11/2022 09:08:09 -------------------------------------------------------------------------------- Physical Exam Details Patient Name: Date of Service: Terri Burnett, Terri Burnett 04/11/2022 8:30 A M Medical Record Number: [id number] Patient Account Number: [id number] Date of Birth/Sex: Treating RN: [birth year] (67 y.o. Terri Burnett Primary Care Provider: Beverlyn Roux Other Clinician: Referring  Provider: Treating Provider/Extender: Lockie Pares Weeks in Treatment: 5 Constitutional Well-nourished and well-hydrated in no acute distress. Respiratory normal breathing without difficulty. Psychiatric this patient is able to make decisions and  demonstrates good insight into disease process. Alert and Oriented x 3. pleasant and cooperative. Notes Upon inspection patient's wound bed actually showed signs of good granulation epithelization at this point. Fortunately I see no signs of active infection locally nor systemically which is great news and overall I think she is completely healed. Electronic Signature(s) Signed: 04/11/2022 9:08:23 AM By: Worthy Keeler PA-C Entered By: Worthy Keeler on 04/11/2022 [id number] -------------------------------------------------------------------------------- Physician Orders Details Patient Name: Date of Service: Terri Burnett. 04/11/2022 8:30 A M Medical Record Number: [id number] Patient Account Number: [id number] Date of Birth/Sex: Treating RN: 1955/09/07 (67 y.o. 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 Wear compression stockings at  night Wear compression garments daily. Put garments on first thing when you wake up and remove them before bed. Moisturize legs daily after removing compression garments. Electronic Signature(s) Signed: 04/11/2022 1:48:29 PM By: Worthy Keeler PA-C Signed: 04/12/2022 4:52:42 PM By: Rosalio Loud MSN RN CNS WTA Entered By: Rosalio Loud on 04/11/2022 09:18:27 -------------------------------------------------------------------------------- Problem List Details Patient Name: Date of Service: Terri Burnett. 04/11/2022 8:30 A M Medical Record Number: [id number] Patient Account Number: [id number] Date of Birth/Sex: Treating RN: 03-19-1955 (67 y.o. Terri Burnett Primary Care Provider: Beverlyn Roux Other Clinician: Referring Provider: Treating Provider/Extender: Lockie Pares Weeks in Treatment: 5 Active Problems ICD-10 Encounter Code Description Active Date MDM Diagnosis T81.31XA Disruption of external operation (surgical) wound, not elsewhere classified, 03/07/2022 No Yes initial encounter L97.312 Non-pressure chronic ulcer of right ankle with fat layer exposed 03/07/2022 No Yes E11.622 Type 2 diabetes mellitus with other skin ulcer 03/07/2022 No Yes I10 Essential (primary) hypertension 03/07/2022 No Yes I69.351 Hemiplegia and hemiparesis following cerebral infarction affecting right 03/07/2022 No Yes dominant side G40.89 Other seizures 03/07/2022 No Yes Inactive Problems Terri Burnett, Terri Burnett ([id number]) [id number] Page 4 of 6 Resolved Problems Electronic Signature(s) Signed: 04/11/2022 8:24:45 AM By: Worthy Keeler PA-C Entered By: Worthy Keeler on 04/11/2022 08:24:45 -------------------------------------------------------------------------------- Progress Note Details Patient Name: Date of Service: Terri Burnett. 04/11/2022 8:30 A M Medical Record Number: [id number] Patient Account Number: [id number] Date of Birth/Sex:  Treating RN: [birth year] (67 y.o. Terri Burnett Primary Care Provider: Beverlyn Roux Other Clinician: Referring Provider: Treating Provider/Extender: Lockie Pares Weeks in Treatment: 5 Subjective Chief Complaint Information obtained from Patient Right lateral ankle ulcer History of Present Illness (HPI) 03-07-2022 upon evaluation today patient presents for initial inspection here in our clinic concerning issues she has been having with a wound on the right lateral ankle after she had a fall with ankle fracture on the beginning part of November to middle 2023. She tells me she was hospitalized on November 14 where she did have internal fixation of the ankle fracture. With that being said unfortunately she developed an issue following the discharge where the wound did not heal when the staples were eventually removed this was  still not closed and dehisced to some degree. Fortunately there does not appear to be any evidence of infection locally nor systemically which is great news. No fevers, chills, nausea, vomiting, or diarrhea. Patient does have a history of diabetes mellitus type 2, hypertension, stroke where she does still have some right-sided weakness noted currently, and again a history of seizures though she is not sure exactly why this happened as of yet. 1/25; right lateral ankle wound in the mid part of his surgical incision from a fracture that was initially obtained in November with wound dehiscence in December. We used silver collagen on this last week they are changing it every other day. She arrived in clinic today with some unusual probing hyper granulated tissue. This was nonadherent to the sides of the wound. Apparently quite a bit different from 10 days ago 04-01-2022 upon evaluation today patient appears to be doing well currently in regard to her wound which is actually significantly smaller compared to last time I saw her. There is still a very small opening but  nothing anywhere like what was originally seen. Overall I am extremely pleased with where we stand today. 04-11-2022 upon evaluation today patient appears to be doing well currently in regard to her wound in fact this appears to be completely healed based on what I am seeing. Fortunately there does not appear to be any signs of active infection locally nor systemically at this time which is great news. Objective Constitutional Well-nourished and well-hydrated in no acute distress. Vitals Time Taken: 8:46 AM, Height: 67 in, Weight: 205 lbs, BMI: 32.1, Temperature: 98.4 F, Pulse: 78 bpm, Respiratory Rate: 16 breaths/min, Blood Pressure: 143/82 mmHg. Respiratory normal breathing without difficulty. Psychiatric this patient is able to make decisions and demonstrates good insight into disease process. Alert and Oriented x 3. pleasant and cooperative. General Notes: Upon inspection patient's wound bed actually showed signs of good granulation epithelization at this point. Fortunately I see no signs of active infection locally nor systemically which is great news and overall I think she is completely healed. Terri Burnett, Terri Burnett [id number] ([id number]) [id number] Page 5 of 6 Integumentary (Hair, Skin) Wound #1 status is Healed - Epithelialized. Original cause of wound was Surgical Injury. The date acquired was: 01/25/2022. The wound has been in treatment 5 weeks. The wound is located on the Right,Lateral Ankle. The wound measures 0cm length x 0cm width x 0cm depth; 0cm^2 area and 0cm^3 volume. There is a medium amount of serosanguineous drainage noted. There is large (67-100%) hyper - granulation within the wound bed. There is a small (1-33%) amount of necrotic tissue within the wound bed. Assessment Active Problems ICD-10 Disruption of external operation (surgical) wound, not elsewhere classified, initial encounter Non-pressure chronic ulcer of right ankle with fat layer  exposed Type 2 diabetes mellitus with other skin ulcer Essential (primary) hypertension Hemiplegia and hemiparesis following cerebral infarction affecting right dominant side Other seizures Plan Discharge From [facility] Services: Discharge from [place] [facility] Complete - May use A and D ointment at night. Wear compression stockings at night Wear compression garments daily. Put garments on first thing when you wake up and remove them before bed. Moisturize legs daily after removing compression garments. Patient is currently get a be discharged from the wound care center that she is completely healed she may use AandD ointment at night and wear compression stockings throughout the day. So the way that should look is that she takes the stockings off at bedtime puts the AandD  ointment on and then wakes up in the morning to put on her stockings. Hopefully this will keep everything in good control as far as the edema and swelling is concerned. Will see her back for follow-up visit as needed. Electronic Signature(s) Signed: 04/21/2022 5:22:37 PM By: Worthy Keeler PA-C Previous Signature: 04/11/2022 9:09:17 AM Version By: Worthy Keeler PA-C Entered By: Worthy Keeler on 04/21/2022 17:22:37 -------------------------------------------------------------------------------- SuperBill Details Patient Name: Date of Service: Terri Burnett. 04/11/2022 Medical Record Number: [id number] Patient Account Number: [id number] Date of Birth/Sex: Treating RN: 1955/04/07 (67 y.o. 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 G40.89 Other seizures Facility Procedures : CPT4 Code: [id number] Description: [id number] - WOUND CARE VISIT-LEV 2 EST PT Modifier: Quantity: 1 Physician Procedures : CPT4 Code Description Modifier [id number] - WC PHYS LEVEL 3 - EST PT ICD-10 Diagnosis Description T81.31XA Disruption of external operation (surgical) wound, not elsewhere classified, initial encounter [id number] Non-pressure chronic ulcer of right  ankle with fat layer exposed E11.622 Type 2 diabetes mellitus with other skin ulcer I10 Essential (primary) hypertension Quantity: 1 Electronic Signature(s) Signed: 04/11/2022 1:48:29 PM By: Worthy Keeler PA-C Signed: 04/12/2022 4:52:42 PM By: Rosalio Loud MSN RN CNS WTA Previous Signature: 04/11/2022 9:09:49 AM Version By: Worthy Keeler PA-C Entered By: Rosalio Loud on 04/11/2022 09:19:20

## 2022-04-11 NOTE — Progress Notes (Addendum)
Terri Burnett [id number] ([id number]) [id number] Page 1 of 8 Visit Report for 04/11/2022 Arrival Information Details Patient Name: Date of Service: Terri Burnett, Terri Burnett. 04/11/2022 8:30 A M Medical Record Number: [id number] Patient Account Number: [id number] Date of Birth/Sex: Treating RN: [birth year] (67 y.o. Terri Burnett Primary Care Terri Burnett: Terri Burnett Other Clinician: Referring Terri Burnett: Treating Terri Burnett/Extender: Terri Burnett in Treatment: 5 Visit Information History Since Last Visit Added or deleted any medications: No Patient Arrived: Wheel Chair Any new allergies or adverse reactions: No Arrival Time: 08:42 Had a fall or experienced change in No Accompanied By: daughter activities of daily living that may affect Transfer Assistance: None risk of falls: Patient Identification Verified: Yes Hospitalized since last visit: No Secondary Verification Process Completed: Yes Has Dressing in Place as Prescribed: Yes Patient Requires Transmission-Based Precautions: No Pain Present Now: No Patient Has Alerts: Yes Patient Alerts: Patient on Blood Thinner DIABETIC R ABI 1.06 1/31 L ABI 1.04 1/31 Electronic Signature(s) Signed: 04/12/2022 4:52:42 PM By: Terri Loud MSN RN CNS WTA Entered By: Terri Burnett on 04/11/2022 09:01:40 -------------------------------------------------------------------------------- Clinic Level of Care Assessment Details Patient Name: Date of Service: Terri Burnett. 04/11/2022 8:30 A M Medical Record Number: [id number] Patient Account Number: [id number] Date of Birth/Sex: Treating RN: [birth year] (67 y.o. Terri Burnett Primary Care Inocencio Roy: Terri Burnett Other Clinician: Referring Bari Leib: Treating Terri Burnett/Extender: Terri Burnett in Treatment: 5 Clinic Level of Care Assessment Items TOOL 4 Quantity Score X- 1 0 Use when only an EandM is performed on FOLLOW-UP  visit ASSESSMENTS - Nursing Assessment / Reassessment X- 1 10 Reassessment of Co-morbidities (includes updates in patient status) X- 1 5 Reassessment of Adherence to Treatment Plan ASSESSMENTS - Wound and Skin Assessment / Reassessment Terri Burnett, Terri Burnett ([id number]) [id number] Page 2 of 8 X- 1 5 Simple Wound Assessment / Reassessment - one wound []$  - 0 Complex Wound Assessment / Reassessment - multiple wounds []$  - 0 Dermatologic / Skin Assessment (not related to wound area) ASSESSMENTS - Focused Assessment []$  - 0 Circumferential Edema Measurements - multi extremities []$  - 0 Nutritional Assessment / Counseling / Intervention []$  - 0 Lower Extremity Assessment (monofilament, tuning fork, pulses) []$  - 0 Peripheral Arterial Disease Assessment (using hand held doppler) ASSESSMENTS - Ostomy and/or Continence Assessment and Care []$  - 0 Incontinence Assessment and Management []$  - 0 Ostomy Care Assessment and Management (repouching, etc.) PROCESS - Coordination of Care X - Simple Patient / Family Education for ongoing care 1 15 []$  - 0 Complex (extensive) Patient / Family Education for ongoing care X- 1 10 Staff obtains Programmer, systems, Records, T Results / Process Orders est []$  - 0 Staff telephones HHA, Nursing Homes / Clarify orders / etc []$  - 0 Routine Transfer to another Facility (non-emergent condition) []$  - 0 Routine Hospital Admission (non-emergent condition) []$  - 0 New Admissions / Biomedical engineer / Ordering NPWT Apligraf, etc. , []$  - 0 Emergency Hospital Admission (emergent condition) X- 1 10 Simple Discharge Coordination []$  - 0 Complex (extensive) Discharge Coordination PROCESS - Special Needs []$  - 0 Pediatric / Minor Patient Management []$  - 0 Isolation Patient Management []$  - 0 Hearing / Language / Visual special needs []$  - 0 Assessment of Community assistance (transportation, D/C planning, etc.) []$  - 0 Additional  assistance / Altered mentation []$  - 0 Support Surface(s) Assessment (bed, cushion, seat, etc.) INTERVENTIONS - Wound Cleansing / Measurement X - Simple Wound Cleansing - one wound 1 5 []$  - 0 Complex Wound Cleansing -  multiple wounds X- 1 5 Wound Imaging (photographs - any number of wounds) []$  - 0 Wound Tracing (instead of photographs) X- 1 5 Simple Wound Measurement - one wound []$  - 0 Complex Wound Measurement - multiple wounds INTERVENTIONS - Wound Dressings []$  - 0 Small Wound Dressing one or multiple wounds []$  - 0 Medium Wound Dressing one or multiple wounds []$  - 0 Large Wound Dressing one or multiple wounds []$  - 0 Application of Medications - topical []$  - 0 Application of Medications - injection INTERVENTIONS - Miscellaneous []$  - 0 External ear exam []$  - 0 Specimen Collection (cultures, biopsies, blood, body fluids, etc.) Terri Burnett, Terri Burnett ([id number]) [id number] Page 3 of 8 []$  - 0 Specimen(s) / Culture(s) sent or taken to Lab for analysis []$  - 0 Patient Transfer (multiple staff / Harrel Lemon Lift / Similar devices) []$  - 0 Simple Staple / Suture removal (25 or less) []$  - 0 Complex Staple / Suture removal (26 or more) []$  - 0 Hypo / Hyperglycemic Management (close monitor of Blood Glucose) []$  - 0 Ankle / Brachial Index (ABI) - do not check if billed separately X- 1 5 Vital Signs Has the patient been seen at the hospital within the last three years: Yes Total Score: 75 Level Of Care: New/Established - Level 2 Electronic Signature(s) Signed: 04/12/2022 4:52:42 PM By: Terri Loud MSN RN CNS WTA Entered By: Terri Burnett on 04/11/2022 09:19:05 -------------------------------------------------------------------------------- Encounter Discharge Information Details Patient Name: Date of Service: Terri Burnett. 04/11/2022 8:30 A M Medical Record Number: [id number] Patient Account Number: [id number] Date of Birth/Sex: Treating  RN: Terri Burnett (67 y.o. Terri Burnett Primary Care Lachanda Buczek: Terri Burnett Other Clinician: Referring Terri Burnett: Treating Terri Burnett/Extender: Terri Burnett in Treatment: 5 Encounter Discharge Information Items Discharge Condition: Stable Ambulatory Status: Wheelchair Discharge Destination: Home Transportation: Private Auto Accompanied By: daughter Schedule Follow-up Appointment: No Clinical Summary of Care: Electronic Signature(s) Signed: 04/12/2022 4:52:42 PM By: Terri Loud MSN RN CNS WTA Entered By: Terri Burnett on 04/11/2022 09:20:29 -------------------------------------------------------------------------------- Lower Extremity Assessment Details Patient Name: Date of Service: Terri Burnett. 04/11/2022 8:30 A M Medical Record Number: [id number] Patient Account Number: [id number] Terri Burnett, Terri Burnett ([id number]) [phone].pdf Page 4 of 8 Date of Birth/Sex: Treating RN: [birth year] Burnett (67 y.o. Terri Burnett Primary Care Torre Pikus: Terri Burnett Other Clinician: Referring Brindley Madarang: Treating Berdine Rasmusson/Extender: Terri Burnett in Treatment: 5 Edema Assessment Assessed: [Left: No] [Right: No] [Left: Edema] [Right: :] Calf Left: Right: Point of Measurement: 35 cm From Medial Instep 32 cm Ankle Left: Right: Point of Measurement: 10 cm From Medial Instep 24 cm Vascular Assessment Pulses: Dorsalis Pedis Palpable: [Right:Yes] Electronic Signature(s) Signed: 04/12/2022 4:52:42 PM By: Terri Loud MSN RN CNS WTA Entered By: Terri Burnett on 04/11/2022 09:05:21 -------------------------------------------------------------------------------- Multi Wound Chart Details Patient Name: Date of Service: Terri Burnett. 04/11/2022 8:30 A M Medical Record Number: [id number] Patient Account Number: [id number] Date of Birth/Sex: Treating RN: [birth year] (67 y.o. Terri Burnett Primary Care Ross Hefferan: Terri Burnett  Other Clinician: Referring Avory Rahimi: Treating Hamdi Kley/Extender: Terri Burnett in Treatment: 5 Vital Signs Height(in): 67 Pulse(bpm): 78 Weight(lbs): 205 Blood Pressure(mmHg): 143/82 Body Mass Index(BMI): 32.1 Temperature(F): 98.4 Respiratory Rate(breaths/min): 16 [1:Photos:] [N/A:N/A] Right, Lateral Ankle N/A N/A Wound Location: Surgical Injury N/A N/A Wounding Event: Dehisced Wound N/A N/A Primary Etiology: Hypertension, Type II Diabetes N/A N/A Comorbid HistoryMarland Kitchen [facility], [facility] ([id number]) [id number] Page 5 of 8 01/25/2022 N/A N/A Date Acquired: 5 N/A N/A Burnett of Treatment: Open N/A  N/A Wound Status: No N/A N/A Wound Recurrence: 0.2x0.2x0.1 N/A N/A Measurements L x W x D (cm) 0.031 N/A N/A A (cm) : rea 0.003 N/A N/A Volume (cm) : 96.70% N/A N/A % Reduction in Area: 99.20% N/A N/A % Reduction in Volume: Full Thickness Without Exposed N/A N/A Classification: Support Structures Medium N/A N/A Exudate Amount: Serosanguineous N/A N/A Exudate Type: red, brown N/A N/A Exudate Color: Large (67-100%) N/A N/A Granulation Amount: Small (1-33%) N/A N/A Necrotic Amount: Fascia: No N/A N/A Exposed Structures: Fat Layer (Subcutaneous Tissue): No Tendon: No Muscle: No Joint: No Bone: No None N/A N/A Epithelialization: Treatment Notes Electronic Signature(s) Signed: 04/12/2022 4:52:42 PM By: Terri Loud MSN RN CNS WTA Entered By: Terri Burnett on 04/11/2022 09:01:54 -------------------------------------------------------------------------------- Multi-Disciplinary Care Plan Details Patient Name: Date of Service: Terri Burnett. 04/11/2022 8:30 A M Medical Record Number: [id number] Patient Account Number: [id number] Date of Birth/Sex: Treating RN: Burn[birth year] (67 y.o. Terri Burnett Primary Care Montee Tallman: Terri Burnett Other Clinician: Referring Afnan Emberton: Treating Caydee Talkington/Extender: Terri Burnett in Treatment: 5 Active Inactive Electronic Signature(s) Signed: 04/12/2022 4:52:42 PM By: Terri Loud MSN RN CNS WTA Entered By: Terri Burnett on 04/11/2022 09:19:42 Pain Assessment Details -------------------------------------------------------------------------------- Terri Burnett ([id number]) [id number] Page 6 of 8 Patient Name: Date of Service: Terri Burnett, Terri Burnett. 04/11/2022 8:30 A M Medical Record Number: [id number] Patient Account Number: [id number] Date of Birth/Sex: Treating RN: [birth year] (67 y.o. Terri Burnett Primary Care Tilly Pernice: Terri Burnett Other Clinician: Referring Nadia Torr: Treating Darcee Dekker/Extender: Terri Burnett in Treatment: 5 Active Problems Location of Pain Severity and Description of Pain Patient Has Paino No Site Locations Pain Management and Medication Current Pain Management: Electronic Signature(s) Signed: 04/12/2022 4:52:42 PM By: Terri Loud MSN RN CNS WTA Entered By: Terri Burnett on 04/11/2022 08:46:45 -------------------------------------------------------------------------------- Patient/Caregiver Education Details Patient Name: Date of Service: Terri Burnett, Terri M. 2/19/2024andnbsp8:30 A M Medical Record Number: [id number] Patient Account Number: [id number] Date of Birth/Gender: Treating RN: [birth year] Burnett (67 y.o. Terri Burnett Primary Care Physician: Terri Burnett Other Clinician: Referring Physician: Treating Physician/Extender: Terri Burnett in Treatment: 5 Education Assessment Education Provided To: Patient Education Topics Provided Electronic Signature(s) Signed: 04/12/2022 4:52:42 PM By: Terri Loud MSN RN CNS WTA Entered By: Terri Burnett on 04/11/2022 09:19:48 Terri Burnett, Terri Burnett ([id number]) [id number] Page 7 of  8 -------------------------------------------------------------------------------- Wound Assessment Details Patient Name: Date of Service: Terri Burnett, Terri Burnett. 04/11/2022 8:30 A M Medical Record Number: [id number] Patient Account Number: [id number] Date of Birth/Sex: Treating RN: [birth year]Burnett (67 y.o. Terri Burnett Primary Care Azam Gervasi: Terri Burnett Other Clinician: Referring Jary Louvier: Treating Flornce Record/Extender: Terri Burnett in Treatment: 5 Wound Status Wound Number: 1 Primary Etiology: Dehisced Wound Wound Location: Right, Lateral Ankle Wound Status: Healed - Epithelialized Wounding Event: Surgical Injury Comorbid History: Hypertension, Type II Diabetes Date Acquired: 01/25/2022 Burnett Of Treatment: 5 Clustered Wound: No Photos Wound Measurements Length: (cm) Width: (cm) Depth: (cm) Area: (cm) Volume: (cm) 0 % Reduction in Area: 100% 0 % Reduction in Volume: 100% 0 Epithelialization: None 0 0 Wound Description Classification: Full Thickness Without Exposed Suppor Exudate Amount: Medium Exudate Type: Serosanguineous Exudate Color: red, brown t Structures Foul Odor After Cleansing: No Slough/Fibrino Yes Wound Bed Granulation Amount: Large (67-100%) Exposed Structure Granulation Quality: Hyper-granulation Fascia Exposed: No Necrotic Amount: Small (1-33%) Fat Layer (Subcutaneous Tissue) Exposed: No Tendon Exposed: No Muscle Exposed: No Joint Exposed: No Bone Exposed: No Treatment Notes Wound #1 (Ankle) Wound Laterality: Right, Lateral Cleanser Peri-Wound  Care Terri Burnett, Terri Burnett [id number] ([id number]) [id number] Page 8 of 8 Topical Primary Dressing Secondary Dressing Secured With Compression Wrap Compression Stockings Add-Ons Electronic Signature(s) Signed: 04/12/2022 4:52:42 PM By: Terri Loud MSN RN CNS WTA Entered By: Terri Burnett on 04/11/2022  09:03:43 -------------------------------------------------------------------------------- Vitals Details Patient Name: Date of Service: Terri Burnett. 04/11/2022 8:30 A M Medical Record Number: [id number] Patient Account Number: [id number] Date of Birth/Sex: Treating RN: Burn[birth year] (67 y.o. Terri Burnett Primary Care Canuto Kingston: Terri Burnett Other Clinician: Referring Marquay Kruse: Treating Abeer Iversen/Extender: Terri Burnett in Treatment: 5 Vital Signs Time Taken: 08:46 Temperature (F): 98.4 Height (in): 67 Pulse (bpm): 78 Weight (lbs): 205 Respiratory Rate (breaths/min): 16 Body Mass Index (BMI): 32.1 Blood Pressure (mmHg): 143/82 Reference Range: 80 - 120 mg / dl Electronic Signature(s) Signed: 04/12/2022 4:52:42 PM By: Terri Loud MSN RN CNS WTA Entered By: Terri Burnett on 04/11/2022 08:46:38

## 2022-05-06 ENCOUNTER — Ambulatory Visit: Payer: 59 | Admitting: Podiatry

## 2022-05-13 ENCOUNTER — Ambulatory Visit (INDEPENDENT_AMBULATORY_CARE_PROVIDER_SITE_OTHER): Payer: 59 | Admitting: Podiatry

## 2022-05-13 ENCOUNTER — Ambulatory Visit (INDEPENDENT_AMBULATORY_CARE_PROVIDER_SITE_OTHER): Payer: 59

## 2022-05-13 ENCOUNTER — Encounter: Payer: Self-pay | Admitting: Podiatry

## 2022-05-13 VITALS — BP 175/100 | HR 70

## 2022-05-13 DIAGNOSIS — Z8781 Personal history of (healed) traumatic fracture: Secondary | ICD-10-CM

## 2022-05-13 DIAGNOSIS — Z9889 Other specified postprocedural states: Secondary | ICD-10-CM

## 2022-05-13 NOTE — Progress Notes (Signed)
Subjective:  Patient ID: Terri Burnett, female    DOB: [birth year],  MRN: [id number]  Chief Complaint  Patient presents with   Routine Post Op    "It's doing okay.  It feels like something is hard in it.  It has sharp pains sometime."    DOS: 01/04/2022 Procedure: Right ORIF of ankle fracture with syndesmotic repair  67 y.o. female returns for post-op check.  Patient states she is doing okay.  She is having pain.  She is doing well.  The wound has healed.  She is here as a follow-up to discuss weakness in the right ankle  Review of Systems: Negative except as noted in the HPI. Denies N/V/F/Ch.  Past Medical History:  Diagnosis Date   Diabetes mellitus without complication ([place])    Hypertension    Stroke [facility])     Current Outpatient Medications:    amLODipine (NORVASC) 10 MG tablet, Take 10 mg by mouth daily., Disp: , Rfl:    atorvastatin (LIPITOR) 40 MG tablet, Take 40 mg by mouth at bedtime. , Disp: , Rfl:    clopidogrel (PLAVIX) 75 MG tablet, Take 75 mg by mouth daily., Disp: , Rfl:    ibuprofen (ADVIL) 800 MG tablet, Take 1 tablet (800 mg total) by mouth every 6 (six) hours as needed., Disp: 60 tablet, Rfl: 1   lacosamide (VIMPAT) 50 MG TABS tablet, Take 1 tablet (50 mg total) by mouth 2 (two) times daily., Disp: 60 tablet, Rfl: 1   losartan (COZAAR) 100 MG tablet, Take 100 mg by mouth daily., Disp: , Rfl:    metFORMIN (GLUCOPHAGE-XR) 500 MG 24 hr tablet, Take 500 mg by mouth daily with breakfast., Disp: , Rfl:    oxyCODONE (ROXICODONE) 5 MG immediate release tablet, Take 1 tablet (5 mg total) by mouth every 6 (six) hours as needed for severe pain., Disp: 20 tablet, Rfl: 0   oxyCODONE-acetaminophen (PERCOCET) 5-325 MG tablet, Take 1 tablet by mouth every 4 (four) hours as needed for severe pain., Disp: 30 tablet, Rfl: 0   oxyCODONE-acetaminophen (PERCOCET) 5-325 MG tablet, Take 1 tablet by mouth every 4 (four) hours as needed for severe pain., Disp: 30 tablet, Rfl: 0    oxyCODONE-acetaminophen (PERCOCET) 5-325 MG tablet, Take 1 tablet by mouth every 4 (four) hours as needed for severe pain., Disp: 30 tablet, Rfl: 0   oxyCODONE-acetaminophen (PERCOCET) 5-325 MG tablet, Take 1 tablet by mouth every 4 (four) hours as needed for severe pain., Disp: 30 tablet, Rfl: 0   oxyCODONE-acetaminophen (PERCOCET) 5-325 MG tablet, Take 1 tablet by mouth every 4 (four) hours as needed for severe pain., Disp: 30 tablet, Rfl: 0  Social History   Tobacco Use  Smoking Status Former   Packs/day: 1.00   Years: 34.00   Additional pack years: 0.00   Total pack years: 34.00   Types: Cigarettes   Quit date: 2009   Years since quitting: 15.2  Smokeless Tobacco Never    No Known Allergies Objective:   Vitals:   05/13/22 1200  BP: (!) 175/100  Pulse: 70   There is no height or weight on file to calculate BMI. Constitutional Well developed. Well nourished.  Vascular Foot warm and well perfused. Capillary refill normal to all digits.   Neurologic Normal speech. Oriented to person, place, and time. Epicritic sensation to light touch grossly present bilaterally.  Dermatologic Skin is healing.  Wound has completely epithelialized.  No signs of Deis is noted no complication noted.  Good range of  motion noted at the ankle joint.  Limited range of motion noted at passively due to weakness  Orthopedic: No further tenderness to palpation noted about the surgical site.   Radiographs: 3 views of skeletally mature the right foot: Hardware is intact no signs of backing out or loosening noted.  Reduction of syndesmosis noted.  Hardware is intact.  Good consolidation noted noted across the fracture site. Assessment:   1. History of ankle fracture      Plan:  Patient was evaluated and treated and all questions answered.  S/p foot surgery right -Clinically healed and doing well.  Generalized weakness noted to the right lower extremity I believe physical therapy will help with  this. -She was given a prescription for renewal of physical therapy to begin strengthening exercises to the right lower extremity No follow-ups on file.

## 2022-06-28 ENCOUNTER — Ambulatory Visit: Payer: 59 | Admitting: Podiatry

## 2022-07-12 ENCOUNTER — Ambulatory Visit (INDEPENDENT_AMBULATORY_CARE_PROVIDER_SITE_OTHER): Payer: 59

## 2022-07-12 ENCOUNTER — Ambulatory Visit (INDEPENDENT_AMBULATORY_CARE_PROVIDER_SITE_OTHER): Payer: 59 | Admitting: Podiatry

## 2022-07-12 DIAGNOSIS — Z8781 Personal history of (healed) traumatic fracture: Secondary | ICD-10-CM | POA: Diagnosis not present

## 2022-07-12 NOTE — Progress Notes (Signed)
Subjective:  Patient ID: Terri Burnett, female    DOB: 04-19-1955,  MRN: [id number]  Chief Complaint  Patient presents with   Routine Post Op    "It's doing okay.  It feels like something is hard in it.  It has sharp pains sometime."    DOS: 01/04/2022 Procedure: Right ORIF of ankle fracture with syndesmotic repair  67 y.o. female returns for post-op check.  Patient states she is doing okay.  She is having pain.  She is doing well.  The wound has healed.  She is here as a follow-up to discuss weakness in the right ankle  Review of Systems: Negative except as noted in the HPI. Denies N/V/F/Ch.  Past Medical History:  Diagnosis Date   Diabetes mellitus without complication ([facility])    Hypertension    Stroke [facility])     Current Outpatient Medications:    amLODipine (NORVASC) 10 MG tablet, Take 10 mg by mouth daily., Disp: , Rfl:    atorvastatin (LIPITOR) 40 MG tablet, Take 40 mg by mouth at bedtime. , Disp: , Rfl:    clopidogrel (PLAVIX) 75 MG tablet, Take 75 mg by mouth daily., Disp: , Rfl:    ibuprofen (ADVIL) 800 MG tablet, Take 1 tablet (800 mg total) by mouth every 6 (six) hours as needed., Disp: 60 tablet, Rfl: 1   lacosamide (VIMPAT) 50 MG TABS tablet, Take 1 tablet (50 mg total) by mouth 2 (two) times daily., Disp: 60 tablet, Rfl: 1   losartan (COZAAR) 100 MG tablet, Take 100 mg by mouth daily., Disp: , Rfl:    metFORMIN (GLUCOPHAGE-XR) 500 MG 24 hr tablet, Take 500 mg by mouth daily with breakfast., Disp: , Rfl:    oxyCODONE (ROXICODONE) 5 MG immediate release tablet, Take 1 tablet (5 mg total) by mouth every 6 (six) hours as needed for severe pain., Disp: 20 tablet, Rfl: 0   oxyCODONE-acetaminophen (PERCOCET) 5-325 MG tablet, Take 1 tablet by mouth every 4 (four) hours as needed for severe pain., Disp: 30 tablet, Rfl: 0   oxyCODONE-acetaminophen (PERCOCET) 5-325 MG tablet, Take 1 tablet by mouth every 4 (four) hours as needed for severe pain., Disp: 30 tablet, Rfl: 0    oxyCODONE-acetaminophen (PERCOCET) 5-325 MG tablet, Take 1 tablet by mouth every 4 (four) hours as needed for severe pain., Disp: 30 tablet, Rfl: 0   oxyCODONE-acetaminophen (PERCOCET) 5-325 MG tablet, Take 1 tablet by mouth every 4 (four) hours as needed for severe pain., Disp: 30 tablet, Rfl: 0   oxyCODONE-acetaminophen (PERCOCET) 5-325 MG tablet, Take 1 tablet by mouth every 4 (four) hours as needed for severe pain., Disp: 30 tablet, Rfl: 0  Social History   Tobacco Use  Smoking Status Former   Packs/day: 1.00   Years: 34.00   Additional pack years: 0.00   Total pack years: 34.00   Types: Cigarettes   Quit date: 2009   Years since quitting: 15.2  Smokeless Tobacco Never    No Known Allergies Objective:   Vitals:   05/13/22 1200  BP: (!) 175/100  Pulse: 70   There is no height or weight on file to calculate BMI. Constitutional Well developed. Well nourished.  Vascular Foot warm and well perfused. Capillary refill normal to all digits.   Neurologic Normal speech. Oriented to person, place, and time. Epicritic sensation to light touch grossly present bilaterally.  Dermatologic Skin is healing.  Wound has completely epithelialized.  No signs of Deis is noted no complication noted.  Good range of  motion noted at the ankle joint.  Limited range of motion noted at passively due to weakness  Orthopedic: No further tenderness to palpation noted about the surgical site.   Radiographs: 3 views of skeletally mature the right foot: Hardware is intact no signs of backing out or loosening noted.  Reduction of syndesmosis noted.  Hardware is intact.  Good consolidation noted noted across the fracture site. Assessment:   1. History of ankle fracture      Plan:  Patient was evaluated and treated and all questions answered.  S/p foot surgery right -Clinically healed and officially discharged from my care.  At this time patient has returned back to normal activities without any  restrictions.  She will discontinue physical therapy.  If any foot and ankle issues on future she will come back and see me. No follow-ups on file.

## 2022-07-28 ENCOUNTER — Encounter: Payer: 59 | Attending: Physician Assistant | Admitting: Physician Assistant

## 2022-07-28 DIAGNOSIS — E11622 Type 2 diabetes mellitus with other skin ulcer: Secondary | ICD-10-CM | POA: Insufficient documentation

## 2022-07-28 DIAGNOSIS — Z87891 Personal history of nicotine dependence: Secondary | ICD-10-CM | POA: Insufficient documentation

## 2022-07-28 DIAGNOSIS — I69351 Hemiplegia and hemiparesis following cerebral infarction affecting right dominant side: Secondary | ICD-10-CM | POA: Insufficient documentation

## 2022-07-28 DIAGNOSIS — L97312 Non-pressure chronic ulcer of right ankle with fat layer exposed: Secondary | ICD-10-CM | POA: Insufficient documentation

## 2022-07-28 DIAGNOSIS — I10 Essential (primary) hypertension: Secondary | ICD-10-CM | POA: Insufficient documentation

## 2022-07-28 DIAGNOSIS — W19XXXA Unspecified fall, initial encounter: Secondary | ICD-10-CM | POA: Diagnosis not present

## 2022-08-02 ENCOUNTER — Ambulatory Visit (INDEPENDENT_AMBULATORY_CARE_PROVIDER_SITE_OTHER): Payer: 59

## 2022-08-02 ENCOUNTER — Ambulatory Visit (INDEPENDENT_AMBULATORY_CARE_PROVIDER_SITE_OTHER): Payer: 59 | Admitting: Podiatry

## 2022-08-02 DIAGNOSIS — L97312 Non-pressure chronic ulcer of right ankle with fat layer exposed: Secondary | ICD-10-CM

## 2022-08-02 DIAGNOSIS — Z8781 Personal history of (healed) traumatic fracture: Secondary | ICD-10-CM | POA: Diagnosis not present

## 2022-08-02 NOTE — Progress Notes (Signed)
Subjective:  Patient ID: Terri Burnett, female    DOB: January 19, 1956,  MRN: [id number]  Chief Complaint  Patient presents with   Wound Check    67 y.o. female presents with the above complaint.  Patient presents with another complaint of right ankle wound that reoccurred and is probing down to deep tissue/plate.  Patient states that just opened up after he was healed over.  She wanted to get it evaluated.  She states that the surgery was almost 8 months ago with ORIF of the ankle.  She denies any other acute complaints she is a diabetic likely leading to this complication.   Review of Systems: Negative except as noted in the HPI. Denies N/V/F/Ch.  Past Medical History:  Diagnosis Date   Diabetes mellitus without complication (HCC)    Hypertension    Stroke [facility])     Current Outpatient Medications:    amLODipine (NORVASC) 10 MG tablet, Take 10 mg by mouth daily., Disp: , Rfl:    atorvastatin (LIPITOR) 40 MG tablet, Take 40 mg by mouth at bedtime. , Disp: , Rfl:    clopidogrel (PLAVIX) 75 MG tablet, Take 75 mg by mouth daily., Disp: , Rfl:    ibuprofen (ADVIL) 800 MG tablet, Take 1 tablet (800 mg total) by mouth every 6 (six) hours as needed., Disp: 60 tablet, Rfl: 1   lacosamide (VIMPAT) 50 MG TABS tablet, Take 1 tablet (50 mg total) by mouth 2 (two) times daily., Disp: 60 tablet, Rfl: 1   losartan (COZAAR) 100 MG tablet, Take 100 mg by mouth daily., Disp: , Rfl:    metFORMIN (GLUCOPHAGE-XR) 500 MG 24 hr tablet, Take 500 mg by mouth daily with breakfast., Disp: , Rfl:    oxyCODONE (ROXICODONE) 5 MG immediate release tablet, Take 1 tablet (5 mg total) by mouth every 6 (six) hours as needed for severe pain., Disp: 20 tablet, Rfl: 0   oxyCODONE-acetaminophen (PERCOCET) 5-325 MG tablet, Take 1 tablet by mouth every 4 (four) hours as needed for severe pain., Disp: 30 tablet, Rfl: 0   oxyCODONE-acetaminophen (PERCOCET) 5-325 MG tablet, Take 1 tablet by mouth every 4 (four) hours as  needed for severe pain., Disp: 30 tablet, Rfl: 0   oxyCODONE-acetaminophen (PERCOCET) 5-325 MG tablet, Take 1 tablet by mouth every 4 (four) hours as needed for severe pain., Disp: 30 tablet, Rfl: 0   oxyCODONE-acetaminophen (PERCOCET) 5-325 MG tablet, Take 1 tablet by mouth every 4 (four) hours as needed for severe pain., Disp: 30 tablet, Rfl: 0   oxyCODONE-acetaminophen (PERCOCET) 5-325 MG tablet, Take 1 tablet by mouth every 4 (four) hours as needed for severe pain., Disp: 30 tablet, Rfl: 0  Social History   Tobacco Use  Smoking Status Former   Packs/day: 1.00   Years: 34.00   Additional pack years: 0.00   Total pack years: 34.00   Types: Cigarettes   Quit date: 2009   Years since quitting: 15.4  Smokeless Tobacco Never    No Known Allergies Objective:  There were no vitals filed for this visit. There is no height or weight on file to calculate BMI. Constitutional Well developed. Well nourished.  Vascular Dorsalis pedis pulses palpable bilaterally. Posterior tibial pulses palpable bilaterally. Capillary refill normal to all digits.  No cyanosis or clubbing noted. Pedal hair growth normal.  Neurologic Normal speech. Oriented to person, place, and time. Epicritic sensation to light touch grossly present bilaterally.  Dermatologic Nails well groomed and normal in appearance. No open wounds. No skin lesions.  Orthopedic: Pain on palpation to the right ankle.  Circumferential wound noted with sinus tract probing down to the hardware.  No purulent drainage noted.  No signs of infection noted.   Radiographs: 3 views of skeletally mature the right ankle: Hardware is intact no signs of backing out or loosening noted. Assessment:   1. History of ankle fracture   2. Ankle ulcer, right, with fat layer exposed (HCC)    Plan:  Patient was evaluated and treated and all questions answered.  Right ankle wound with a history of ankle fracture -All questions and concerns were  discussed with the patient in extensive detail at this time the wound the hardware has been present for greater than 6 months therefore patient may have consolidation across the fracture site at this point I will order CT scan to assess for solid fusion if the fracture site is healed patient does not require hardware and we will plan on doing a hardware removal I discussed this with the patient she states understanding and would like to proceed with getting CT scan. -CT scan was ordered  No follow-ups on file.

## 2022-08-05 ENCOUNTER — Ambulatory Visit: Payer: 59 | Admitting: Physician Assistant

## 2022-08-10 ENCOUNTER — Ambulatory Visit
Admission: RE | Admit: 2022-08-10 | Discharge: 2022-08-10 | Disposition: A | Discharge status: Home / Self Care | Payer: 59 | Source: Ambulatory Visit | Attending: Podiatry | Admitting: Podiatry

## 2022-08-10 ENCOUNTER — Other Ambulatory Visit: Payer: 59

## 2022-08-10 DIAGNOSIS — Z8781 Personal history of (healed) traumatic fracture: Secondary | ICD-10-CM

## 2022-08-17 NOTE — Progress Notes (Addendum)
Terri, [place] ([id number]) [id number] Page 1 of 9 Visit Report for 07/28/2022 Allergy List Details Patient Name: Date of Service: Terri Burnett, Terri Burnett. 07/28/2022 9:30 A M Medical Record Number: [id number] Patient Account Number: [id number] Date of Birth/Sex: Treating RN: 17-Feb-1956 (66 y.o. Female) Yevonne Pax Primary Care Viviano Bir: Beverely Low Other Clinician: Referring Beva Remund: Treating Jaretzy Lhommedieu/Extender: Jackolyn Confer Weeks in Treatment: 0 Allergies Active Allergies No Known Allergies Allergy Notes Electronic Signature(s) Signed: 08/17/2022 11:52:51 AM By: Yevonne Pax RN Entered By: Yevonne Pax on 07/28/2022 09:24:44 -------------------------------------------------------------------------------- Arrival Information Details Patient Name: Date of Service: Terri Burnett. 07/28/2022 9:30 A M Medical Record Number: [id number] Patient Account Number: [id number] Date of Birth/Sex: Treating RN: May 18, 1955 (67 y.o. Female) Yevonne Pax Primary Care Lougenia Morrissey: Beverely Low Other Clinician: Referring Quindon Denker: Treating Karia Ehresman/Extender: Mauri Pole in Treatment: 0 Visit Information Patient Arrived: Ambulatory Arrival Time: 09:17 Accompanied By: daughter Transfer Assistance: None Patient Identification Verified: Yes Secondary Verification Process Completed: Yes Patient Requires Transmission-Based Precautions: No Patient Has Alerts: Yes Patient Alerts: Patient on Blood Thinner DIABETIC L ABI 1.04 TBI .88 1/24 R ABI 1.06 TBI .60 1/24 History Since Last Visit Added or deleted any medications: No Any new allergies or adverse reactions: No Had a fall or experienced change in activities of daily living that may affect risk of falls: No Signs or symptoms of abuse/neglect since last visito No Hospitalized since last visit: No Implantable device outside of the clinic excluding cellular tissue based products  placed in the center since last visit: No Electronic Signature(s) JEWELLE, ROWBERRY ([id number]) [id number] Page 2 of 9 Signed: 08/17/2022 11:52:51 AM By: Yevonne Pax RN Entered By: Yevonne Pax on 07/28/2022 09:41:30 -------------------------------------------------------------------------------- Clinic Level of Care Assessment Details Patient Name: Date of Service: Terri, Burnett 07/28/2022 9:30 A M Medical Record Number: [id number] Patient Account Number: [id number] Date of Birth/Sex: Treating RN: [birth year] (67 y.o. Female) Yevonne Pax Primary Care Harriette Tovey: Beverely Low Other Clinician: Referring Kersten Salmons: Treating Teren Zurcher/Extender: Jackolyn Confer Weeks in Treatment: 0 Clinic Level of Care Assessment Items TOOL 2 Quantity Score X- 1 0 Use when only an EandM is performed on the INITIAL visit ASSESSMENTS - Nursing Assessment / Reassessment X- 1 20 General Physical Exam (combine w/ comprehensive assessment (listed just below) when performed on new pt. evals) X- 1 25 Comprehensive Assessment (HX, ROS, Risk Assessments, Wounds Hx, etc.) ASSESSMENTS - Wound and Skin A ssessment / Reassessment X - Simple Wound Assessment / Reassessment - one wound 1 5 []  - 0 Complex Wound Assessment / Reassessment - multiple wounds []  - 0 Dermatologic / Skin Assessment (not related to wound area) ASSESSMENTS - Ostomy and/or Continence Assessment and Care []  - 0 Incontinence Assessment and Management []  - 0 Ostomy Care Assessment and Management (repouching, etc.) PROCESS - Coordination of Care X - Simple Patient / Family Education for ongoing care 1 15 []  - 0 Complex (extensive) Patient / Family Education for ongoing care []  - 0 Staff obtains Chiropractor, Records, T Results / Process Orders est []  - 0 Staff telephones HHA, Nursing Homes / Clarify orders / etc []  - 0 Routine Transfer to another Facility (non-emergent condition) []  -  0 Routine Hospital Admission (non-emergent condition) X- 1 15 New Admissions / Manufacturing engineer / Ordering NPWT Apligraf, etc. , []  - 0 Emergency Hospital Admission (emergent condition) X- 1 10 Simple Discharge Coordination []  - 0 Complex (extensive) Discharge Coordination PROCESS - Special Needs []  - 0 Pediatric / Minor Patient Management []  -  0 Isolation Patient Management []  - 0 Hearing / Language / Visual special needs []  - 0 Assessment of Community assistance (transportation, D/C planning, etc.) []  - 0 Additional assistance / Altered mentation [name], [name] ([id number]) [id number] Page 3 of 9 []  - 0 Support Surface(s) Assessment (bed, cushion, seat, etc.) INTERVENTIONS - Wound Cleansing / Measurement X- 1 5 Wound Imaging (photographs - any number of wounds) []  - 0 Wound Tracing (instead of photographs) X- 1 5 Simple Wound Measurement - one wound []  - 0 Complex Wound Measurement - multiple wounds X- 1 5 Simple Wound Cleansing - one wound []  - 0 Complex Wound Cleansing - multiple wounds INTERVENTIONS - Wound Dressings X - Small Wound Dressing one or multiple wounds 1 10 []  - 0 Medium Wound Dressing one or multiple wounds []  - 0 Large Wound Dressing one or multiple wounds []  - 0 Application of Medications - injection INTERVENTIONS - Miscellaneous []  - 0 External ear exam []  - 0 Specimen Collection (cultures, biopsies, blood, body fluids, etc.) []  - 0 Specimen(s) / Culture(s) sent or taken to Lab for analysis []  - 0 Patient Transfer (multiple staff / Nurse, adult / Similar devices) []  - 0 Simple Staple / Suture removal (25 or less) []  - 0 Complex Staple / Suture removal (26 or more) []  - 0 Hypo / Hyperglycemic Management (close monitor of Blood Glucose) []  - 0 Ankle / Brachial Index (ABI) - do not check if billed separately Has the patient been seen at the hospital within the last three years: Yes Total Score:  115 Level Of Care: New/Established - Level 3 Electronic Signature(s) Signed: 08/17/2022 11:52:51 AM By: Yevonne Pax RN Entered By: Yevonne Pax on 08/02/2022 10:25:53 -------------------------------------------------------------------------------- Encounter Discharge Information Details Patient Name: Date of Service: Terri Burnett. 07/28/2022 9:30 A M Medical Record Number: [id number] Patient Account Number: [id number] Date of Birth/Sex: Treating RN: 06-11-1955 (67 y.o. Female) Yevonne Pax Primary Care Kamya Watling: Beverely Low Other Clinician: Referring Kendrew Paci: Treating Ornella Coderre/Extender: Mauri Pole in Treatment: 0 Encounter Discharge Information Items Discharge Condition: Stable Ambulatory Status: Ambulatory Discharge Destination: Home Transportation: Private Auto Accompanied By: daughter Schedule Follow-up Appointment: Yes Clinical Summary of Care: SUTTYN, DERIGGI ([id number]) [id number] Page 4 of 9 Electronic Signature(s) Signed: 07/28/2022 10:39:26 AM By: Yevonne Pax RN Entered By: Yevonne Pax on 07/28/2022 10:39:26 -------------------------------------------------------------------------------- Lower Extremity Assessment Details Patient Name: Date of Service: YARISBEL, BOATENG 07/28/2022 9:30 A M Medical Record Number: [id number] Patient Account Number: [id number] Date of Birth/Sex: Treating RN: 10-08-1955 (67 y.o. Female) Yevonne Pax Primary Care Merrick Maggio: Beverely Low Other Clinician: Referring Lezlie Ritchey: Treating Delaynie Stetzer/Extender: Jackolyn Confer Weeks in Treatment: 0 Edema Assessment Assessed: [Left: No] [Right: No] Edema: [Left: Ye] [Right: s] Calf Left: Right: Point of Measurement: 34 cm From Medial Instep 34 cm Ankle Left: Right: Point of Measurement: 10 cm From Medial Instep 23 cm Knee To Floor Left: Right: From Medial Instep 43 cm Vascular Assessment Pulses: Dorsalis  Pedis Palpable: [Right:Yes] Electronic Signature(s) Signed: 08/17/2022 11:52:51 AM By: Yevonne Pax RN Entered By: Yevonne Pax on 07/28/2022 09:40:24 -------------------------------------------------------------------------------- Multi Wound Chart Details Patient Name: Date of Service: Terri Burnett. 07/28/2022 9:30 A M Medical Record Number: [id number] Patient Account Number: [id number] RHAVEN, HEITZ ([id number]) [id number] Page 5 of 9 Date of Birth/Sex: Treating RN: [birth year] (67 y.o. Female) Yevonne Pax Primary Care Willadean Guyton: Beverely Low Other Clinician: Referring Lakeysha Slutsky: Treating Scarleth Brame/Extender: Jackolyn Confer Weeks in Treatment: 0 Vital Signs Height(in): 67 Pulse(bpm): 80 Weight(lbs):  200 Blood Pressure(mmHg): 168/74 Body Mass Index(BMI): 31.3 Temperature(F): 97.8 Respiratory Rate(breaths/min): 18 [2:Photos:] [N/A:N/A] Right, Lateral Ankle N/A N/A Wound Location: Surgical Injury N/A N/A Wounding Event: Dehisced Wound N/A N/A Primary Etiology: Hypertension, Type II Diabetes N/A N/A Comorbid History: 01/25/2022 N/A N/A Date Acquired: 0 N/A N/A Weeks of Treatment: Open N/A N/A Wound Status: No N/A N/A Wound Recurrence: 0.4x0.3x0.2 N/A N/A Measurements L x W x D (cm) 0.094 N/A N/A A (cm) : rea 0.019 N/A N/A Volume (cm) : 6 Starting Position 1 (o'clock): 12 Ending Position 1 (o'clock): 1.1 Maximum Distance 1 (cm): Yes N/A N/A Undermining: Full Thickness Without Exposed N/A N/A Classification: Support Structures Medium N/A N/A Exudate Amount: Purulent N/A N/A Exudate Type: yellow, brown, green N/A N/A Exudate Color: Medium (34-66%) N/A N/A Granulation Amount: Pink N/A N/A Granulation Quality: Medium (34-66%) N/A N/A Necrotic Amount: Fat Layer (Subcutaneous Tissue): Yes N/A N/A Exposed Structures: Fascia: No Tendon: No Muscle: No Joint: No Bone: No None N/A  N/A Epithelialization: Treatment Notes Electronic Signature(s) Signed: 08/17/2022 11:52:51 AM By: Yevonne Pax RN Entered By: Yevonne Pax on 07/28/2022 09:41:37 -------------------------------------------------------------------------------- Multi-Disciplinary Care Plan Details Patient Name: Date of Service: Terri Burnett. 07/28/2022 9:30 A Lucy Chris, Briyonna M ([id number]) [id number] Page 6 of 9 Medical Record Number: [id number] Patient Account Number: [id number] Date of Birth/Sex: Treating RN: 11-12-1955 (67 y.o. Female) Yevonne Pax Primary Care Joniyah Mallinger: Beverely Low Other Clinician: Referring Mackenize Delgadillo: Treating Birdena Kingma/Extender: Jackolyn Confer Weeks in Treatment: 0 Active Inactive Electronic Signature(s) Signed: 08/19/2022 12:23:17 PM By: Elliot Gurney, BSN, RN, CWS, Kim RN, BSN Signed: 08/22/2022 1:52:37 PM By: Yevonne Pax RN Previous Signature: 08/17/2022 11:52:51 AM Version By: Yevonne Pax RN Entered By: Elliot Gurney BSN, RN, CWS, Kim on 08/19/2022 12:23:17 -------------------------------------------------------------------------------- Pain Assessment Details Patient Name: Date of Service: Terri Burnett. 07/28/2022 9:30 A M Medical Record Number: [id number] Patient Account Number: [id number] Date of Birth/Sex: Treating RN: 05-Nov-1955 (67 y.o. Female) Yevonne Pax Primary Care Koralynn Greenspan: Beverely Low Other Clinician: Referring Rashed Edler: Treating Avia Merkley/Extender: Jackolyn Confer Weeks in Treatment: 0 Active Problems Location of Pain Severity and Description of Pain Patient Has Paino No Site Locations Pain Management and Medication Current Pain Management: Electronic Signature(s) Signed: 08/17/2022 11:52:51 AM By: Yevonne Pax RN Entered By: Yevonne Pax on 07/28/2022 09:22:55 Boyajian, Zoejane M ([id number]) [id number] Page 7 of  9 -------------------------------------------------------------------------------- Patient/Caregiver Education Details Patient Name: Date of Service: Debarr, Meaghen M. 6/6/2024andnbsp9:30 A M Medical Record Number: [id number] Patient Account Number: [id number] Date of Birth/Gender: Treating RN: 1955/04/20 (66 y.o. Female) Yevonne Pax Primary Care Physician: Beverely Low Other Clinician: Referring Physician: Treating Physician/Extender: Mauri Pole in Treatment: 0 Education Assessment Education Provided To: Patient Education Topics Provided Welcome [facility]New [facility] Packet: o Handouts: Welcome T [facility] o Methods: Explain/Verbal Responses: State content correctly Electronic Signature(s) Signed: 08/17/2022 11:52:51 AM By: Yevonne Pax RN Entered By: Yevonne Pax on 07/28/2022 09:43:30 -------------------------------------------------------------------------------- Wound Assessment Details Patient Name: Date of Service: PATSY, HERSHEY 07/28/2022 9:30 A M Medical Record Number: [id number] Patient Account Number: [id number] Date of Birth/Sex: Treating RN: [birth year] (67 y.o. Female) Yevonne Pax Primary Care Sonora Catlin: Beverely Low Other Clinician: Referring Jaylee Lantry: Treating Christinamarie Tall/Extender: Jackolyn Confer Weeks in Treatment: 0 Wound Status Wound Number: 2 Primary Etiology: Dehisced Wound Wound Location: Right, Lateral Ankle Wound Status: Open Wounding Event: Surgical Injury Comorbid History: Hypertension, Type II Diabetes Date Acquired: 01/25/2022 Weeks Of Treatment: 0 Clustered Wound: No Photos Neumann, Cheyeanne M (  [id number]) [id number] Page 8 of 9 Wound Measurements Length: (cm) 0.4 Width: (cm) 0.3 Depth: (cm) 0.2 Area: (cm) 0.094 Volume: (cm) 0.019 % Reduction in Area: % Reduction in Volume: Epithelialization: None Tunneling: No Undermining:  Yes Starting Position (o'clock): 6 Ending Position (o'clock): 12 Maximum Distance: (cm) 1.1 Wound Description Classification: Full Thickness Without Exposed Suppor Exudate Amount: Medium Exudate Type: Purulent Exudate Color: yellow, brown, green t Structures Foul Odor After Cleansing: No Slough/Fibrino Yes Wound Bed Granulation Amount: Medium (34-66%) Exposed Structure Granulation Quality: Pink Fascia Exposed: No Necrotic Amount: Medium (34-66%) Fat Layer (Subcutaneous Tissue) Exposed: Yes Necrotic Quality: Adherent Slough Tendon Exposed: No Muscle Exposed: No Joint Exposed: No Bone Exposed: No Electronic Signature(s) Signed: 08/17/2022 11:52:51 AM By: Yevonne Pax RN Entered By: Yevonne Pax on 07/28/2022 09:38:52 -------------------------------------------------------------------------------- Vitals Details Patient Name: Date of Service: Terri Burnett. 07/28/2022 9:30 A M Medical Record Number: [id number] Patient Account Number: [id number] Date of Birth/Sex: Treating RN: 1955-07-17 (67 y.o. Female) Yevonne Pax Primary Care Annalicia Renfrew: Beverely Low Other Clinician: Referring Blia Totman: Treating Jaree Trinka/Extender: Jackolyn Confer Weeks in Treatment: 0 Vital Signs Time Taken: 09:23 Temperature (F): 97.8 Height (in): 67 Pulse (bpm): 80 Source: Stated Respiratory Rate (breaths/min): 18 Weight (lbs): 200 Blood Pressure (mmHg): 168/74 Source: Stated Reference Range: 80 - 120 mg / dl Body Mass Index (BMI): 31.3 ZAKERIA, KARTCHNER ([id number]) [id number] Page 9 of 9 Electronic Signature(s) Signed: 08/17/2022 11:52:51 AM By: Yevonne Pax RN Entered By: Yevonne Pax on 07/28/2022 09:24:14

## 2022-08-17 NOTE — Progress Notes (Signed)
Terri Burnett ([id number]) [id number] Page 1 of 4 Visit Report for 07/28/2022 Abuse Risk Screen Details Patient Name: Date of Service: Terri Burnett, Terri Burnett. 07/28/2022 9:30 A M Medical Record Number: [id number] Patient Account Number: [id number] Date of Birth/Sex: Treating RN: 01/04/1956 (67 y.o. Female) Yevonne Pax Primary Care Adair Lauderback: Beverely Low Other Clinician: Referring Deigo Alonso: Treating Nikeia Henkes/Extender: Jackolyn Confer Weeks in Treatment: 0 Abuse Risk Screen Items Answer ABUSE RISK SCREEN: Has anyone close to you tried to hurt or harm you recentlyo No Do you feel uncomfortable with anyone in your familyo No Has anyone forced you do things that you didnt want to doo No Electronic Signature(s) Signed: 08/17/2022 11:52:51 AM By: Yevonne Pax RN Entered By: Yevonne Pax on 07/28/2022 09:25:09 -------------------------------------------------------------------------------- Activities of Daily Living Details Patient Name: Date of Service: Terri Burnett 07/28/2022 9:30 A M Medical Record Number: [id number] Patient Account Number: [id number] Date of Birth/Sex: Treating RN: 08-27-1955 (67 y.o. Female) Yevonne Pax Primary Care Patrica Mendell: Beverely Low Other Clinician: Referring Estell Dillinger: Treating Hebert Dooling/Extender: Jackolyn Confer Weeks in Treatment: 0 Activities of Daily Living Items Answer Activities of Daily Living (Please select one for each item) Drive Automobile Not Able T Medications ake Completely Able Use T elephone Completely Able Care for Appearance Completely Able Use T oilet Completely Able Bath / Shower Completely Able Dress Self Completely Able Feed Self Completely Able Walk Completely Able Get In / Out Bed Completely Able Housework Completely LLESENIA, [name] ([id number]) [id number] Page 2 of 4 Prepare Meals Completely Able Handle  Money Completely Able Shop for Self Completely Able Electronic Signature(s) Signed: 08/17/2022 11:52:51 AM By: Yevonne Pax RN Entered By: Yevonne Pax on 07/28/2022 09:25:33 -------------------------------------------------------------------------------- Education Screening Details Patient Name: Date of Service: [name]. 07/28/2022 9:30 A M Medical Record Number: [id number] Patient Account Number: [id number] Date of Birth/Sex: Treating RN: 22-Sep-1955 (67 y.o. Female) Yevonne Pax Primary Care Niema Carrara: Beverely Low Other Clinician: Referring Laveah Gloster: Treating Praise Dolecki/Extender: Mauri Pole in Treatment: 0 Primary Learner Assessed: Patient Learning Preferences/Education Level/Primary Language Learning Preference: Explanation Highest Education Level: Grade School Preferred Language: English Cognitive Barrier Language Barrier: No Translator Needed: No Memory Deficit: No Emotional Barrier: No Cultural/Religious Beliefs Affecting Medical Care: No Physical Barrier Impaired Vision: Yes Glasses Impaired Hearing: No Decreased Hand dexterity: No Knowledge/Comprehension Knowledge Level: Medium Comprehension Level: High Ability to understand written instructions: High Ability to understand verbal instructions: High Motivation Anxiety Level: Anxious Cooperation: Cooperative Education Importance: Acknowledges Need Interest in Health Problems: Asks Questions Perception: Coherent Willingness to Engage in Self-Management High Activities: Readiness to Engage in Self-Management High Activities: Electronic Signature(s) Signed: 08/17/2022 11:52:51 AM By: Yevonne Pax RN Entered By: Yevonne Pax on 07/28/2022 09:26:18 Gang, Natlie M ([id number]) [id number] Page 3 of 4 -------------------------------------------------------------------------------- Fall Risk Assessment Details Patient Name: Date of  Service: [name], [name]. 07/28/2022 9:30 A M Medical Record Number: [id number] Patient Account Number: [id number] Date of Birth/Sex: Treating RN: Oct 04, 1955 (67 y.o. Female) Yevonne Pax Primary Care Leitha Hyppolite: Beverely Low Other Clinician: Referring Keora Eccleston: Treating Ramon Brant/Extender: Jackolyn Confer Weeks in Treatment: 0 Fall Risk Assessment Items Have you had 2 or more falls in the last 12 monthso 0 No Have you had any fall that resulted in injury in the last 12 monthso 0 No FALLS RISK SCREEN History of falling - immediate or within 3 months 0 No Secondary diagnosis (Do you have 2 or more medical diagnoseso) 0 No Ambulatory aid None/bed rest/wheelchair/nurse 0 Yes Crutches/cane/walker 0  No Furniture 0 No Intravenous therapy Access/Saline/Heparin Lock 0 No Gait/Transferring Normal/ bed rest/ wheelchair 0 Yes Weak (short steps with or without shuffle, stooped but able to lift head while walking, may seek 0 No support from furniture) Impaired (short steps with shuffle, may have difficulty arising from chair, head down, impaired 0 No balance) Mental Status Oriented to own ability 0 Yes Electronic Signature(s) Signed: 08/17/2022 11:52:51 AM By: Yevonne Pax RN Entered By: Yevonne Pax on 07/28/2022 09:26:53 -------------------------------------------------------------------------------- Nutrition Risk Screening Details Patient Name: Date of Service: Terri Burnett 07/28/2022 9:30 A M Medical Record Number: [id number] Patient Account Number: [id number] Date of Birth/Sex: Treating RN: 12-Sep-1955 (67 y.o. Female) Yevonne Pax Primary Care Jaeleah Smyser: Beverely Low Other Clinician: Referring Georgia Delsignore: Treating Isatu Macinnes/Extender: Jackolyn Confer Weeks in Treatment: 0 Height (in): 67 Weight (lbs): 200 Body Mass Index (BMI): 31.3 GERALYNN, CAPRI ([id number]) [id number].pdf Page 4 of 4 Nutrition Risk Screening  Items Score Screening NUTRITION RISK SCREEN: I have an illness or condition that made me change the kind and/or amount of food I eat 0 No I eat fewer than two meals per day 0 No I eat few fruits and vegetables, or milk products 0 No I have three or more drinks of beer, liquor or wine almost every day 0 No I have tooth or mouth problems that make it hard for me to eat 0 No I don't always have enough money to buy the food I need 0 No I eat alone most of the time 0 No I take three or more different prescribed or over-the-counter drugs a day 1 Yes Without wanting to, I have lost or gained 10 pounds in the last six months 0 No I am not always physically able to shop, cook and/or feed myself 0 No Nutrition Protocols Good Risk Protocol 0 No interventions needed Moderate Risk Protocol High Risk Proctocol Risk Level: Good Risk Score: 1 Electronic Signature(s) Signed: 08/17/2022 11:52:51 AM By: Yevonne Pax RN Entered By: Yevonne Pax on 07/28/2022 08:65:78

## 2022-08-17 NOTE — Progress Notes (Signed)
Terri Burnett, Terri Burnett ([id number]) [id number] Page 1 of 8 Visit Report for 07/28/2022 Chief Complaint Document Details Patient Name: Date of Service: Terri Burnett, Terri Burnett. 07/28/2022 9:30 A M Medical Record Number: [id number] Patient Account Number: [id number] Date of Birth/Sex: Treating RN: 03/30/1955 (67 y.o. Female) Yevonne Pax Primary Care Provider: Beverely Low Other Clinician: Referring Provider: Treating Provider/Extender: Jackolyn Confer Weeks in Treatment: 0 Information Obtained from: Patient Chief Complaint Right lateral ankle ulcer Electronic Signature(s) Signed: 07/28/2022 9:48:29 AM By: Allen Derry PA-C Entered By: Allen Derry on 07/28/2022 09:48:29 -------------------------------------------------------------------------------- Debridement Details Patient Name: Date of Service: Terri Burnett. 07/28/2022 9:30 A M Medical Record Number: [id number] Patient Account Number: [id number] Date of Birth/Sex: Treating RN: May 25, 1955 (67 y.o. Female) Yevonne Pax Primary Care Provider: Beverely Low Other Clinician: Referring Provider: Treating Provider/Extender: Jackolyn Confer Weeks in Treatment: 0 Debridement Performed for Assessment: Wound #2 Right,Lateral Ankle Performed By: Physician Allen Derry, PA-C Debridement Type: Chemical/Enzymatic/Mechanical Agent Used: saline gauze Level of Consciousness (Pre-procedure): Awake and Alert Pre-procedure Verification/Time Out Yes - 09:45 Taken: Start Time: 09:45 Percent of Wound Bed Debrided: Instrument: Other : saline gauze Bleeding: None End Time: 09:46 Procedural Pain: 0 Post Procedural Pain: 0 Response to Treatment: Procedure was tolerated well Level of Consciousness (Post- Awake and Alert procedure): Post Debridement Measurements of Total Wound Terri Burnett, Terri Burnett ([id number]) [id number] Page 2 of 8 Length: (cm) 0.4 Width: (cm)  0.3 Depth: (cm) 0.2 Volume: (cm) 0.019 Character of Wound/Ulcer Post Debridement: Improved Post Procedure Diagnosis Same as Pre-procedure Electronic Signature(s) Signed: 07/28/2022 10:40:29 AM By: Yevonne Pax RN Signed: 07/28/2022 4:39:56 PM By: Allen Derry PA-C Entered By: Yevonne Pax on 07/28/2022 10:40:29 -------------------------------------------------------------------------------- HPI Details Patient Name: Date of Service: Terri Burnett. 07/28/2022 9:30 A M Medical Record Number: [id number] Patient Account Number: [id number] Date of Birth/Sex: Treating RN: 10-Feb-1956 (67 y.o. Female) Yevonne Pax Primary Care Provider: Beverely Low Other Clinician: Referring Provider: Treating Provider/Extender: Jackolyn Confer Weeks in Treatment: 0 History of Present Illness HPI Description: 03-07-2022 upon evaluation today patient presents for initial inspection here in our clinic concerning issues she has been having with a wound on the right lateral ankle after she had a fall with ankle fracture on the beginning part of November to middle 2023. She tells me she was hospitalized on November 14 where she did have internal fixation of the ankle fracture. With that being said unfortunately she developed an issue following the discharge where the wound did not heal when the staples were eventually removed this was still not closed and dehisced to some degree. Fortunately there does not appear to be any evidence of infection locally nor systemically which is great news. No fevers, chills, nausea, vomiting, or diarrhea. Patient does have a history of diabetes mellitus type 2, hypertension, stroke where she does still have some right-sided weakness noted currently, and again a history of seizures though she is not sure exactly why this happened as of yet. 1/25; right lateral ankle wound in the mid part of his surgical incision from a fracture that was initially obtained in November with  wound dehiscence in December. We used silver collagen on this last week they are changing it every other day. She arrived in clinic today with some unusual probing hyper granulated tissue. This was nonadherent to the sides of the wound. Apparently quite a bit different from 10 days ago 04-01-2022 upon evaluation today patient appears to be doing well currently in regard to her wound which is actually  significantly smaller compared to last time I saw her. There is still a very small opening but nothing anywhere like what was originally seen. Overall I am extremely pleased with where we stand today. 04-11-2022 upon evaluation today patient appears to be doing well currently in regard to her wound in fact this appears to be completely healed based on what I am seeing. Fortunately there does not appear to be any signs of active infection locally nor systemically at this time which is great news. Readmission: 07-28-2022 unfortunately patient presents for follow-up concerning an issue where she has an opening on the surgical site which appears to have dehisced since I saw her last in February. She is actually going through physical therapy and actually states that she was just checked up on and discharged by Dr. Allena Katz. Nonetheless when she removed her sock about a week ago she noticed an area draining on the lateral portion of the heel right more than retreating her before and unfortunately this actually appears to go all the way down to hardware/bone on evaluation today. This obviously is not good. Electronic Signature(s) Signed: 07/28/2022 10:17:39 AM By: Allen Derry PA-C Entered By: Allen Derry on 07/28/2022 10:17:39 Hoeger, Daziyah M ([id number]) [id number] Page 3 of 8 -------------------------------------------------------------------------------- Physical Exam Details Patient Name: Date of Service: Terri Burnett, Terri Burnett 07/28/2022 9:30 A M Medical Record Number:  [id number] Patient Account Number: [id number] Date of Birth/Sex: Treating RN: 02/27/1955 (67 y.o. Female) Yevonne Pax Primary Care Provider: Beverely Low Other Clinician: Referring Provider: Treating Provider/Extender: Jackolyn Confer Weeks in Treatment: 0 Constitutional Well-nourished and well-hydrated in no acute distress. Respiratory normal breathing without difficulty. Psychiatric this patient is able to make decisions and demonstrates good insight into disease process. Alert and Oriented x 3. pleasant and cooperative. Notes Upon inspection patient's wound bed actually showed signs again of having probing all the way down to bone/hardware on the lateral ankle location where this opening is. This indicates to me that there appears to be some type of sinus tract extending from the hardware and I think that she is going to require additional follow-up with Dr. Allena Katz in podiatry in order to further evaluate this. I discussed with her that considering she has the hardware MRIs not the best option she may need a triple phase bone scan which can be much more effective in determining what exactly is going on without any significant distortion of the imaging which can occur again with the MRI and hardware combination. Nonetheless I think this somehow needs to be checked out further as just a plain x-ray is probably not going to help [place] significantly with determining the reason why she has a sinus tract down to the bone that reopened after this long time. Electronic Signature(s) Signed: 07/28/2022 10:19:06 AM By: Allen Derry PA-C Entered By: Allen Derry on 07/28/2022 10:19:06 -------------------------------------------------------------------------------- Physician Orders Details Patient Name: Date of Service: Terri Burnett. 07/28/2022 9:30 A M Medical Record Number: [id number] Patient Account Number: [id number] Date of Birth/Sex: Treating RN: February 04, 1956 (67 y.o. 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 Bathing/ Shower/ Hygiene May shower with wound dressing protected with water repellent cover or cast protector. Anesthetic (Use 'Patient Medications' Section for Anesthetic Order Entry) Lidocaine applied to wound bed Edema Control - Lymphedema / Segmental Compressive Device / Other Elevate, Exercise Daily and A void Standing for Long Periods of Time. Elevate legs to the level of the heart and pump ankles as often as possible Elevate leg(s) parallel to the floor when sitting. Wound Treatment Wound #2 - Ankle Wound Laterality: Right, Lateral Cleanser: [employer]ram Ancillary Kit - 15 Day Supply (DME) (Generic) 3 x Per Week/30 Days Discharge Instructions: Use supplies as instructed; Kit contains: (15) Saline Bullets; (15) 3x3 Gauze; 15 pr Gloves Cleanser: Soap and Water 3 x Per Week/30 Days Discharge Instructions: Gently cleanse wound with antibacterial soap, rinse and pat dry prior to dressing wounds Prim Dressing: Hydrofera Blue Classic Foam Rope Dressing, 9x6 (mm/in) ary 3 x Per Week/30 Days Discharge Instructions: 1/4 rope Secondary Dressing: (BORDER) Zetuvit Plus SILICONE BORDER Dressing 4x4 (in/in) (DME) (Generic) 3 x Per Week/30  Days Discharge Instructions: Please do not put silicone bordered dressings under wraps. Use non-bordered dressing only. Consults Podiatry Dr Allena Katz - [facility]- surgical wound to left ankle has reopened - (ICD10 T81.31XA - Disruption of external operation (surgical) wound, not elsewhere classified, initial encounter) Electronic Signature(s) Signed: 07/28/2022 4:39:56 PM By: Allen Derry PA-C Signed: 08/17/2022 11:52:51 AM By: Yevonne Pax RN Entered By: Yevonne Pax on 07/28/2022 10:42:12 -------------------------------------------------------------------------------- Problem List Details Patient Name: Date of Service: Terri Burnett. 07/28/2022 9:30 A M Medical Record Number: [id number] Patient Account Number: [id number] Date of Birth/Sex: Treating RN: 1955/05/12 (67 y.o. Female) Yevonne Pax Primary Care Provider: Beverely Low Other Clinician: Referring Provider: Treating Provider/Extender: Jackolyn Confer Weeks in Treatment: 0 Active Problems ICD-10 Encounter Code Description Active Date MDM Diagnosis T81.31XA Disruption of external operation (surgical) wound, not elsewhere classified, 07/28/2022 No Yes initial encounter Terri Burnett, Terri Burnett ([id number]) [phone].pdf Page 5 of 8 L97.312 Non-pressure chronic ulcer of right ankle with fat layer exposed 07/28/2022 No Yes E11.622 Type 2 diabetes mellitus with other skin ulcer 07/28/2022 No Yes I10 Essential (primary) hypertension 07/28/2022 No Yes I69.351 Hemiplegia and hemiparesis following cerebral infarction affecting right 07/28/2022 No Yes dominant side G40.89 Other seizures 07/28/2022 No Yes Inactive Problems Resolved Problems Electronic Signature(s) Signed: 07/28/2022 9:48:23 AM By: Allen Derry PA-C Entered By: Allen Derry on 07/28/2022 09:48:22 -------------------------------------------------------------------------------- Progress Note Details Patient Name: Date of  Service: Terri Burnett. 07/28/2022 9:30 A M Medical Record Number: [id number] Patient Account Number: [id number] Date of Birth/Sex: Treating RN: [birth year] (67 y.o. Female) Epps, Carrie Primary Care Provider: Beverely Low Other Clinician: Referring Provider: Treating Provider/Extender: Jackolyn Confer Weeks in Treatment: 0 Subjective Chief Complaint Information obtained from Patient Right lateral ankle ulcer History of Present Illness (HPI) 03-07-2022 upon evaluation today patient presents for initial inspection here in our clinic concerning issues she has been having with a wound on the right lateral ankle after she had a fall with ankle fracture on the beginning part of November to middle 2023. She tells me she was hospitalized on November 14 where she did have internal fixation of the ankle fracture. With that being said unfortunately she developed an issue following the discharge where the wound did not heal when the staples were eventually removed this was still not closed and dehisced to some degree. Fortunately  there does not appear to be any evidence of infection locally nor systemically which is great news. No fevers, chills, nausea, vomiting, or diarrhea. Patient does have a history of diabetes mellitus type 2, hypertension, stroke where she does still have some right-sided weakness noted currently, and again a history of seizures though she is not sure exactly why this happened as of yet. 1/25; right lateral ankle wound in the mid part of his surgical incision from a fracture that was initially obtained in November with wound dehiscence in December. We used silver collagen on this last week they are changing it every other day. She arrived in clinic today with some unusual probing hyper granulated tissue. This was nonadherent to the sides of the wound. Apparently quite a bit different from 10 days ago 04-01-2022 upon evaluation today patient appears to be doing well  currently in regard to her wound which is actually significantly smaller compared to last time I saw her. There is still a very small opening but nothing anywhere like what was originally seen. Overall I am extremely pleased with where we stand today. 04-11-2022 upon evaluation today patient appears to be doing well currently in regard to her wound in fact this appears to be completely healed based on what I Terri Burnett, Terri Burnett ([id number]) [id number] Page 6 of 8 am seeing. Fortunately there does not appear to be any signs of active infection locally nor systemically at this time which is great news. Readmission: 07-28-2022 unfortunately patient presents for follow-up concerning an issue where she has an opening on the surgical site which appears to have dehisced since I saw her last in February. She is actually going through physical therapy and actually states that she was just checked up on and discharged by Dr. Allena Katz. Nonetheless when she removed her sock about a week ago she noticed an area draining on the lateral portion of the heel right more than retreating her before and unfortunately this actually appears to go all the way down to hardware/bone on evaluation today. This obviously is not good. Patient History Allergies No Known Allergies Social History Former smoker, Marital Status - Divorced, Alcohol Use - Never, Drug Use - No History, Caffeine Use - Moderate. Medical History Cardiovascular Patient has history of Hypertension Denies history of Hypotension Endocrine Patient has history of Type II Diabetes - 10 years Objective Constitutional Well-nourished and well-hydrated in no acute distress. Vitals Time Taken: 9:23 AM, Height: 67 in, Source: Stated, Weight: 200 lbs, Source: Stated, BMI: 31.3, Temperature: 97.8 F, Pulse: 80 bpm, Respiratory Rate: 18 breaths/min, Blood Pressure: 168/74 mmHg. Respiratory normal breathing without  difficulty. Psychiatric this patient is able to make decisions and demonstrates good insight into disease process. Alert and Oriented x 3. pleasant and cooperative. General Notes: Upon inspection patient's wound bed actually showed signs again of having probing all the way down to bone/hardware on the lateral ankle location where this opening is. This indicates to me that there appears to be some type of sinus tract extending from the hardware and I think that she is going to require additional follow-up with Dr. Allena Katz in podiatry in order to further evaluate this. I discussed with her that considering she has the hardware MRIs not the best option she may need a triple phase bone scan which can be much more effective in determining what exactly is going on without any significant distortion of the imaging which can occur again with the MRI and hardware combination. Nonetheless I think this somehow needs  to be checked out further as just a plain x-ray is probably not going to help [place] significantly with determining the reason why she has a sinus tract down to the bone that reopened after this long time. Integumentary (Hair, Skin) Wound #2 status is Open. Original cause of wound was Surgical Injury. The date acquired was: 01/25/2022. The wound is located on the Right,Lateral Ankle. The wound measures 0.4cm length x 0.3cm width x 0.2cm depth; 0.094cm^2 area and 0.019cm^3 volume. There is Fat Layer (Subcutaneous Tissue) exposed. There is no tunneling noted, however, there is undermining starting at 6:00 and ending at 12:00 with a maximum distance of 1.1cm. There is a medium amount of purulent drainage noted. There is medium (34-66%) pink granulation within the wound bed. There is a medium (34-66%) amount of necrotic tissue within the wound bed including Adherent Slough. Assessment Active Problems ICD-10 Disruption of external operation (surgical) wound, not elsewhere classified, initial  encounter Non-pressure chronic ulcer of right ankle with fat layer exposed Type 2 diabetes mellitus with other skin ulcer Essential (primary) hypertension Hemiplegia and hemiparesis following cerebral infarction affecting right dominant side Other seizures Plan Follow-up Appointments: Terri Burnett, Terri Burnett ([id number]) [phone].pdf Page 7 of 8 Return Appointment in 1 week. Bathing/ Shower/ Hygiene: May shower with wound dressing protected with water repellent cover or cast protector. Anesthetic (Use 'Patient Medications' Section for Anesthetic Order Entry): Lidocaine applied to wound bed Edema Control - Lymphedema / Segmental Compressive Device / Other: Elevate, Exercise Daily and Avoid Standing for Long Periods of Time. Elevate legs to the level of the heart and pump ankles as often as possible Elevate leg(s) parallel to the floor when sitting. Consults ordered were: Podiatry Dr Allena Katz - [facility] WOUND #2: - Ankle Wound Laterality: Right, Lateral Prim Dressing: Hydrofera Blue Classic Foam Rope Dressing, 9x6 (mm/in) 3 x Per Week/30 Days ary Discharge Instructions: 1/4 rope Secondary Dressing: (BORDER) Zetuvit Plus SILICONE BORDER Dressing 4x4 (in/in) 3 x Per Week/30 Days Discharge Instructions: Please do not put silicone bordered dressings under wraps. Use non-bordered dressing only. 1. From a wound care perspective there is not going to really be much that I can do to help in the current situation and I discussed that with the patient and her daughter. Will use Hydrofera Blue rope to pack into the area which I think is probably the best option but at the same time I do not think this is going to help this to heal as I feel like there is probably something deeper going on I suspect either bone or hardware infection. 2. I am good recommend as well that the patient should continue with the Hydrofera Blue rope until follow-up next week we also can make a  referral to Dr. Eliane Decree office for them to reevaluate this area. 3. I am also going to suggest that the patient may need a triple phase bone scan although mental hold off and let Dr. Allena Katz decide what may be the best option from imaging standpoint. We will see patient back for reevaluation in 1 week here in the clinic. If anything worsens or changes patient will contact our office for additional recommendations. Electronic Signature(s) Signed: 07/28/2022 10:20:25 AM By: Allen Derry PA-C Entered By: Allen Derry on 07/28/2022 10:20:24 -------------------------------------------------------------------------------- ROS/PFSH Details Patient Name: Date of Service: Terri Burnett. 07/28/2022 9:30 A M Medical Record Number: [id number] Patient Account Number: [id number] Date of Birth/Sex: Treating RN: Oct 29, 1955 (67 y.o. Female) Yevonne Pax Primary Care Provider: Beverely Low Other Clinician: Referring  Provider: Treating Provider/Extender: Jackolyn Confer Weeks in Treatment: 0 Cardiovascular Medical History: Positive for: Hypertension Negative for: Hypotension Endocrine Medical History: Positive for: Type II Diabetes - 10 years Time with diabetes: 10 years Treated with: Oral agents Blood sugar tested every day: No Immunizations Pneumococcal Vaccine: Received Pneumococcal Vaccination: No Implantable Devices None Terri Burnett, Terri Burnett ([id number]) [id number] Page 8 of 8 Family and Social History Former smoker; Marital Status - Divorced; Alcohol Use: Never; Drug Use: No History; Caffeine Use: Moderate Electronic Signature(s) Signed: 07/28/2022 4:39:56 PM By: Allen Derry PA-C Signed: 08/17/2022 11:52:51 AM By: Yevonne Pax RN Entered By: Yevonne Pax on 07/28/2022 09:25:02 -------------------------------------------------------------------------------- SuperBill Details Patient Name: Date of Service: Terri Burnett.  07/28/2022 Medical Record Number: [id number] Patient Account Number: [id number] Date of Birth/Sex: Treating RN: 1955-12-26 (67 y.o. 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 G40.89 Other seizures Facility Procedures : CPT4 Code: [id number] Description: 9213 - WOUND CARE VISIT-LEV 3 EST PT Modifier: Quantity: 1 Physician Procedures : CPT4 Code Description Modifier [id number] - WC PHYS LEVEL 4 - EST PT ICD-10 Diagnosis Description T81.31XA Disruption of external operation (surgical) wound, not elsewhere classified, initial encounter L97.312 Non-pressure chronic ulcer of right  ankle with fat layer exposed E11.622 Type 2 diabetes mellitus with other skin ulcer I10 Essential (primary) hypertension Quantity: 1 Electronic Signature(s) Signed: 08/02/2022 10:26:08 AM By: Yevonne Pax RN Signed: 08/02/2022 6:26:43 PM By: Allen Derry PA-C Previous Signature: 07/28/2022 10:20:39 AM Version By: Allen Derry PA-C Entered By: Yevonne Pax on 08/02/2022 10:26:08

## 2022-08-19 ENCOUNTER — Other Ambulatory Visit: Payer: Self-pay

## 2022-08-19 ENCOUNTER — Telehealth: Payer: Self-pay

## 2022-08-19 ENCOUNTER — Other Ambulatory Visit: Payer: Self-pay | Admitting: Family Medicine

## 2022-08-19 DIAGNOSIS — Z1211 Encounter for screening for malignant neoplasm of colon: Secondary | ICD-10-CM

## 2022-08-19 DIAGNOSIS — Z1231 Encounter for screening mammogram for malignant neoplasm of breast: Secondary | ICD-10-CM

## 2022-08-19 MED ORDER — NA SULFATE-K SULFATE-MG SULF 17.5-3.13-1.6 GM/177ML PO SOLN: 1.0000 | Freq: Once | ORAL | 0 refills | Status: AC

## 2022-08-19 NOTE — Telephone Encounter (Signed)
Gastroenterology Pre-Procedure Review  Request Date: 10/12/22 Requesting Physician: Dr. Allegra Lai  PATIENT REVIEW QUESTIONS: The patient responded to the following health history questions as indicated:    1. Are you having any GI issues? no 2. Do you have a personal history of Polyps? no 3. Do you have a family history of Colon Cancer or Polyps? no 4. Diabetes Mellitus? yes 5. Joint replacements in the past 12 months? Ankle surgery Nov 2023 6. Major health problems in the past 3 months?no 7. Any artificial heart valves, MVP, or defibrillator?no    MEDICATIONS & ALLERGIES:    Patient reports the following regarding taking any anticoagulation/antiplatelet therapy:   Plavix, Coumadin, Eliquis, Xarelto, Lovenox, Pradaxa, Brilinta, or Effient? yes (clopidogrel blood thinner advice sent to Dr. Beverely Low) Aspirin? no  Patient confirms/reports the following medications:  Current Outpatient Medications  Medication Sig Dispense Refill   amLODipine (NORVASC) 10 MG tablet Take 10 mg by mouth daily.     atorvastatin (LIPITOR) 40 MG tablet Take 40 mg by mouth at bedtime.      clopidogrel (PLAVIX) 75 MG tablet Take 75 mg by mouth daily.     ibuprofen (ADVIL) 800 MG tablet Take 1 tablet (800 mg total) by mouth every 6 (six) hours as needed. 60 tablet 1   lacosamide (VIMPAT) 50 MG TABS tablet Take 1 tablet (50 mg total) by mouth 2 (two) times daily. 60 tablet 1   losartan (COZAAR) 100 MG tablet Take 100 mg by mouth daily.     metFORMIN (GLUCOPHAGE-XR) 500 MG 24 hr tablet Take 500 mg by mouth daily with breakfast.     oxyCODONE (ROXICODONE) 5 MG immediate release tablet Take 1 tablet (5 mg total) by mouth every 6 (six) hours as needed for severe pain. 20 tablet 0   oxyCODONE-acetaminophen (PERCOCET) 5-325 MG tablet Take 1 tablet by mouth every 4 (four) hours as needed for severe pain. 30 tablet 0   oxyCODONE-acetaminophen (PERCOCET) 5-325 MG tablet Take 1 tablet by mouth every 4 (four) hours as needed  for severe pain. 30 tablet 0   oxyCODONE-acetaminophen (PERCOCET) 5-325 MG tablet Take 1 tablet by mouth every 4 (four) hours as needed for severe pain. 30 tablet 0   oxyCODONE-acetaminophen (PERCOCET) 5-325 MG tablet Take 1 tablet by mouth every 4 (four) hours as needed for severe pain. 30 tablet 0   oxyCODONE-acetaminophen (PERCOCET) 5-325 MG tablet Take 1 tablet by mouth every 4 (four) hours as needed for severe pain. 30 tablet 0   No current facility-administered medications for this visit.    Patient confirms/reports the following allergies:  No Known Allergies  No orders of the defined types were placed in this encounter.   AUTHORIZATION INFORMATION Primary Insurance: 1D#: Group #:  Secondary Insurance: 1D#: Group #:  SCHEDULE INFORMATION: Date: 10/12/22 Time: Location: [facility]

## 2022-08-30 ENCOUNTER — Telehealth: Payer: Self-pay | Admitting: Podiatry

## 2022-08-30 ENCOUNTER — Ambulatory Visit (INDEPENDENT_AMBULATORY_CARE_PROVIDER_SITE_OTHER): Payer: 59 | Admitting: Podiatry

## 2022-08-30 DIAGNOSIS — L97312 Non-pressure chronic ulcer of right ankle with fat layer exposed: Secondary | ICD-10-CM | POA: Diagnosis not present

## 2022-08-30 DIAGNOSIS — T8484XA Pain due to internal orthopedic prosthetic devices, implants and grafts, initial encounter: Secondary | ICD-10-CM

## 2022-08-30 MED ORDER — OXYCODONE-ACETAMINOPHEN 5-325 MG PO TABS: 1.0000 | ORAL_TABLET | ORAL | 0 refills | Status: AC | PRN

## 2022-08-30 NOTE — Telephone Encounter (Signed)
Called to verify you sent the proscription oxyCODONE-acetaminophen verified that you sent over off the AVS

## 2022-09-08 NOTE — Progress Notes (Signed)
Subjective:  Patient ID: Terri Burnett, female    DOB: 10-May-1955,  MRN: [id number]  Chief Complaint  Patient presents with   Wound Check    67 y.o. female presents with the above complaint.  Patient presents with another complaint of right ankle wound that reoccurred and is probing down to deep tissue/plate.  Patient states that just opened up after he was healed over.  She wanted to get it evaluated.  She states that the surgery was almost 8 months ago with ORIF of the ankle.  She denies any other acute complaints she is a diabetic likely leading to this complication.   Review of Systems: Negative except as noted in the HPI. Denies N/V/F/Ch.  Past Medical History:  Diagnosis Date   Diabetes mellitus without complication (HCC)    Hypertension    Stroke [facility])     Current Outpatient Medications:    oxyCODONE-acetaminophen (PERCOCET) 5-325 MG tablet, Take 1 tablet by mouth every 4 (four) hours as needed for severe pain., Disp: 30 tablet, Rfl: 0   amLODipine (NORVASC) 10 MG tablet, Take 10 mg by mouth daily., Disp: , Rfl:    atorvastatin (LIPITOR) 40 MG tablet, Take 40 mg by mouth at bedtime. , Disp: , Rfl:    clopidogrel (PLAVIX) 75 MG tablet, Take 75 mg by mouth daily., Disp: , Rfl:    ibuprofen (ADVIL) 800 MG tablet, Take 1 tablet (800 mg total) by mouth every 6 (six) hours as needed., Disp: 60 tablet, Rfl: 1   lacosamide (VIMPAT) 50 MG TABS tablet, Take 1 tablet (50 mg total) by mouth 2 (two) times daily., Disp: 60 tablet, Rfl: 1   losartan (COZAAR) 100 MG tablet, Take 100 mg by mouth daily., Disp: , Rfl:    metFORMIN (GLUCOPHAGE-XR) 500 MG 24 hr tablet, Take 500 mg by mouth daily with breakfast., Disp: , Rfl:    oxyCODONE (ROXICODONE) 5 MG immediate release tablet, Take 1 tablet (5 mg total) by mouth every 6 (six) hours as needed for severe pain., Disp: 20 tablet, Rfl: 0   oxyCODONE-acetaminophen (PERCOCET) 5-325 MG tablet, Take 1 tablet by mouth every 4 (four) hours as  needed for severe pain., Disp: 30 tablet, Rfl: 0   oxyCODONE-acetaminophen (PERCOCET) 5-325 MG tablet, Take 1 tablet by mouth every 4 (four) hours as needed for severe pain., Disp: 30 tablet, Rfl: 0   oxyCODONE-acetaminophen (PERCOCET) 5-325 MG tablet, Take 1 tablet by mouth every 4 (four) hours as needed for severe pain., Disp: 30 tablet, Rfl: 0   oxyCODONE-acetaminophen (PERCOCET) 5-325 MG tablet, Take 1 tablet by mouth every 4 (four) hours as needed for severe pain., Disp: 30 tablet, Rfl: 0   oxyCODONE-acetaminophen (PERCOCET) 5-325 MG tablet, Take 1 tablet by mouth every 4 (four) hours as needed for severe pain., Disp: 30 tablet, Rfl: 0  Social History   Tobacco Use  Smoking Status Former   Current packs/day: 0.00   Average packs/day: 1 pack/day for 34.0 years (34.0 ttl pk-yrs)   Types: Cigarettes   Start date: 53   Quit date: 2009   Years since quitting: 15.5  Smokeless Tobacco Never    No Known Allergies Objective:  There were no vitals filed for this visit. There is no height or weight on file to calculate BMI. Constitutional Well developed. Well nourished.  Vascular Dorsalis pedis pulses palpable bilaterally. Posterior tibial pulses palpable bilaterally. Capillary refill normal to all digits.  No cyanosis or clubbing noted. Pedal hair growth normal.  Neurologic Normal speech. Oriented  to person, place, and time. Epicritic sensation to light touch grossly present bilaterally.  Dermatologic Nails well groomed and normal in appearance. No open wounds. No skin lesions.  Orthopedic: Pain on palpation to the right ankle.  Circumferential wound noted with sinus tract probing down to the hardware.  No purulent drainage noted.  No signs of infection noted.   Radiographs: 3 views of skeletally mature the right ankle: Hardware is intact no signs of backing out or loosening noted. Assessment:   1. Painful orthopaedic hardware (HCC)   2. Ankle ulcer, right, with fat layer  exposed (HCC)     Plan:  Patient was evaluated and treated and all questions answered.  Right ankle wound with a history of ankle fracture -All questions and concerns were discussed with the patient in extensive detail at  -CT scan was reviewed with the patient in extensive detail and it shows consolidation of the fracture site.  Therefore patient does not need the hardware anymore given that this is bothering her causing her ankle wound I believe she will benefit from removal of hardware with a possible closure of the wound.  I discussed my preoperative intra postop plan with the patient in extensive detail she states understanding would like to proceed with surgery -Informed surgical risk consent was reviewed and read aloud to the patient.  I reviewed the films.  I have discussed my findings with the patient in great detail.  I have discussed all risks including but not limited to infection, stiffness, scarring, limp, disability, deformity, damage to blood vessels and nerves, numbness, poor healing, need for braces, arthritis, chronic pain, amputation, death.  All benefits and realistic expectations discussed in great detail.  I have made no promises as to the outcome.  I have provided realistic expectations.  I have offered the patient a 2nd opinion, which they have declined and assured me they preferred to proceed despite the risks  No follow-ups on file.

## 2022-09-15 ENCOUNTER — Telehealth: Payer: Self-pay | Admitting: Urology

## 2022-09-15 NOTE — Telephone Encounter (Signed)
DOS 10/10/22  REMOVAL HARDWARE X'S 2 RIGHT --- 20680  [facility] EFFECTIVE DATE - 02/21/22  PER [employer] WEBSITE FOR CPT CODE 40102 Notification or Prior Authorization is not required for the requested services You are not required to submit a notification/prior authorization based on the information provided. If you have general questions about the prior authorization requirements, visit [email or web address] > Clinician Resources > Advance and Admission Notification Requirements. The number above acknowledges your notification. Please write this reference number down for future reference. If you would like to request an organization determination, please call us at [phone].  Decision ID #: [id number]

## 2022-09-26 ENCOUNTER — Ambulatory Visit
Admission: RE | Admit: 2022-09-26 | Discharge: 2022-09-26 | Disposition: A | Discharge status: Home / Self Care | Payer: 59 | Source: Ambulatory Visit | Attending: Family Medicine | Admitting: Family Medicine

## 2022-09-26 DIAGNOSIS — Z1231 Encounter for screening mammogram for malignant neoplasm of breast: Secondary | ICD-10-CM | POA: Diagnosis not present

## 2022-10-04 ENCOUNTER — Other Ambulatory Visit: Payer: Self-pay | Admitting: Family Medicine

## 2022-10-04 DIAGNOSIS — R921 Mammographic calcification found on diagnostic imaging of breast: Secondary | ICD-10-CM

## 2022-10-04 DIAGNOSIS — R928 Other abnormal and inconclusive findings on diagnostic imaging of breast: Secondary | ICD-10-CM

## 2022-10-04 DIAGNOSIS — N63 Unspecified lump in unspecified breast: Secondary | ICD-10-CM

## 2022-10-04 NOTE — Progress Notes (Signed)
Blood thinner request needed for procedure.  Thanks, [place], [place]

## 2022-10-07 ENCOUNTER — Ambulatory Visit
Admission: RE | Admit: 2022-10-07 | Discharge: 2022-10-07 | Disposition: A | Discharge status: Home / Self Care | Payer: 59 | Source: Ambulatory Visit | Attending: Family Medicine | Admitting: Family Medicine

## 2022-10-07 ENCOUNTER — Ambulatory Visit: Admission: RE | Admit: 2022-10-07 | Payer: 59 | Source: Ambulatory Visit

## 2022-10-07 DIAGNOSIS — R928 Other abnormal and inconclusive findings on diagnostic imaging of breast: Secondary | ICD-10-CM | POA: Insufficient documentation

## 2022-10-07 DIAGNOSIS — N63 Unspecified lump in unspecified breast: Secondary | ICD-10-CM | POA: Diagnosis present

## 2022-10-07 DIAGNOSIS — R921 Mammographic calcification found on diagnostic imaging of breast: Secondary | ICD-10-CM

## 2022-10-10 ENCOUNTER — Other Ambulatory Visit: Payer: Self-pay | Admitting: Podiatry

## 2022-10-10 DIAGNOSIS — Z4889 Encounter for other specified surgical aftercare: Secondary | ICD-10-CM | POA: Diagnosis not present

## 2022-10-10 MED ORDER — OXYCODONE-ACETAMINOPHEN 5-325 MG PO TABS: 1.0000 | ORAL_TABLET | ORAL | 0 refills | Status: AC | PRN

## 2022-10-10 MED ORDER — IBUPROFEN 800 MG PO TABS: 800.0000 mg | ORAL_TABLET | Freq: Four times a day (QID) | ORAL | 1 refills | Status: AC | PRN

## 2022-10-13 ENCOUNTER — Ambulatory Visit (INDEPENDENT_AMBULATORY_CARE_PROVIDER_SITE_OTHER): Payer: 59

## 2022-10-13 ENCOUNTER — Ambulatory Visit (INDEPENDENT_AMBULATORY_CARE_PROVIDER_SITE_OTHER): Payer: 59 | Admitting: Podiatry

## 2022-10-13 ENCOUNTER — Encounter: Payer: Self-pay | Admitting: Podiatry

## 2022-10-13 DIAGNOSIS — T8484XA Pain due to internal orthopedic prosthetic devices, implants and grafts, initial encounter: Secondary | ICD-10-CM

## 2022-10-13 DIAGNOSIS — Z9889 Other specified postprocedural states: Secondary | ICD-10-CM

## 2022-10-13 NOTE — Progress Notes (Signed)
Subjective:  Patient ID: Terri Burnett, female    DOB: 1955-04-27,  MRN: [id number]  Chief Complaint  Patient presents with   Routine Post Op    Removal of hardware     DOS: 10/10/2022 Procedure: Removal of right foot hardware  67 y.o. female returns for post-op check.  Patient states that she is doing well.  Denies any other acute complaint she is a diabetic.  She noticed the band is getting a little bit more wet so wanted to have it changed out.  Review of Systems: Negative except as noted in the HPI. Denies N/V/F/Ch.  Past Medical History:  Diagnosis Date   Diabetes mellitus without complication (HCC)    Hypertension    Stroke [facility])     Current Outpatient Medications:    amLODipine (NORVASC) 10 MG tablet, Take 10 mg by mouth daily., Disp: , Rfl:    atorvastatin (LIPITOR) 40 MG tablet, Take 40 mg by mouth at bedtime. , Disp: , Rfl:    clopidogrel (PLAVIX) 75 MG tablet, Take 75 mg by mouth daily., Disp: , Rfl:    ibuprofen (ADVIL) 800 MG tablet, Take 1 tablet (800 mg total) by mouth every 6 (six) hours as needed., Disp: 60 tablet, Rfl: 1   ibuprofen (ADVIL) 800 MG tablet, Take 1 tablet (800 mg total) by mouth every 6 (six) hours as needed., Disp: 60 tablet, Rfl: 1   lacosamide (VIMPAT) 50 MG TABS tablet, Take 1 tablet (50 mg total) by mouth 2 (two) times daily., Disp: 60 tablet, Rfl: 1   losartan (COZAAR) 100 MG tablet, Take 100 mg by mouth daily., Disp: , Rfl:    metFORMIN (GLUCOPHAGE-XR) 500 MG 24 hr tablet, Take 500 mg by mouth daily with breakfast., Disp: , Rfl:    oxyCODONE (ROXICODONE) 5 MG immediate release tablet, Take 1 tablet (5 mg total) by mouth every 6 (six) hours as needed for severe pain., Disp: 20 tablet, Rfl: 0   oxyCODONE-acetaminophen (PERCOCET) 5-325 MG tablet, Take 1 tablet by mouth every 4 (four) hours as needed for severe pain., Disp: 30 tablet, Rfl: 0   oxyCODONE-acetaminophen (PERCOCET) 5-325 MG tablet, Take 1 tablet by mouth every 4 (four) hours  as needed for severe pain., Disp: 30 tablet, Rfl: 0   oxyCODONE-acetaminophen (PERCOCET) 5-325 MG tablet, Take 1 tablet by mouth every 4 (four) hours as needed for severe pain., Disp: 30 tablet, Rfl: 0   oxyCODONE-acetaminophen (PERCOCET) 5-325 MG tablet, Take 1 tablet by mouth every 4 (four) hours as needed for severe pain., Disp: 30 tablet, Rfl: 0   oxyCODONE-acetaminophen (PERCOCET) 5-325 MG tablet, Take 1 tablet by mouth every 4 (four) hours as needed for severe pain., Disp: 30 tablet, Rfl: 0   oxyCODONE-acetaminophen (PERCOCET) 5-325 MG tablet, Take 1 tablet by mouth every 4 (four) hours as needed for severe pain., Disp: 30 tablet, Rfl: 0   oxyCODONE-acetaminophen (PERCOCET) 5-325 MG tablet, Take 1 tablet by mouth every 4 (four) hours as needed for severe pain., Disp: 30 tablet, Rfl: 0  Social History   Tobacco Use  Smoking Status Former   Current packs/day: 0.00   Average packs/day: 1 pack/day for 34.0 years (34.0 ttl pk-yrs)   Types: Cigarettes   Start date: 21   Quit date: 2009   Years since quitting: 15.6  Smokeless Tobacco Never    No Known Allergies Objective:  There were no vitals filed for this visit. There is no height or weight on file to calculate BMI. Constitutional Well developed. Well  nourished.  Vascular Foot warm and well perfused. Capillary refill normal to all digits.   Neurologic Normal speech. Oriented to person, place, and time. Epicritic sensation to light touch grossly present bilaterally.  Dermatologic Skin healing well without signs of infection. Skin edges well coapted without signs of infection.  Orthopedic: Tenderness to palpation noted about the surgical site.   Radiographs: 3 views of skeletally mature the right foot: Hardware was mostly removed the lateral plate.  The screw and the suture button are intact and in place Assessment:   1. Painful orthopaedic hardware (HCC)   2. Status post foot surgery    Plan:  Patient was evaluated and  treated and all questions answered.  S/p foot surgery right -Progressing as expected post-operatively. -XR: See above -WB Status: Weightbearing as tolerated in cam boot -Sutures: Intact.  No clinical signs of dehiscence noted.  No complication noted. -Medications: None -Foot redressed.  No follow-ups on file.

## 2022-10-18 ENCOUNTER — Encounter: Payer: 59 | Admitting: Podiatry

## 2022-10-27 ENCOUNTER — Ambulatory Visit (INDEPENDENT_AMBULATORY_CARE_PROVIDER_SITE_OTHER): Payer: 59 | Admitting: Podiatry

## 2022-10-27 DIAGNOSIS — T8484XA Pain due to internal orthopedic prosthetic devices, implants and grafts, initial encounter: Secondary | ICD-10-CM

## 2022-10-27 DIAGNOSIS — Z9889 Other specified postprocedural states: Secondary | ICD-10-CM

## 2022-10-27 NOTE — Progress Notes (Signed)
Subjective:  Patient ID: Terri Burnett, female    DOB: 1955-10-23,  MRN: [id number]  Chief Complaint  Patient presents with   Routine Post Op    DOS: 10/10/2022 Procedure: Removal of right foot hardware  67 y.o. female returns for post-op check.  Patient states that she is doing well.  Denies any other acute complaint she is a diabetic.  She noticed the band is getting a little bit more wet so wanted to have it changed out.  Review of Systems: Negative except as noted in the HPI. Denies N/V/F/Ch.  Past Medical History:  Diagnosis Date   Diabetes mellitus without complication (HCC)    Hypertension    Stroke [facility])     Current Outpatient Medications:    amLODipine (NORVASC) 10 MG tablet, Take 10 mg by mouth daily., Disp: , Rfl:    atorvastatin (LIPITOR) 40 MG tablet, Take 40 mg by mouth at bedtime. , Disp: , Rfl:    clopidogrel (PLAVIX) 75 MG tablet, Take 75 mg by mouth daily., Disp: , Rfl:    ibuprofen (ADVIL) 800 MG tablet, Take 1 tablet (800 mg total) by mouth every 6 (six) hours as needed., Disp: 60 tablet, Rfl: 1   ibuprofen (ADVIL) 800 MG tablet, Take 1 tablet (800 mg total) by mouth every 6 (six) hours as needed., Disp: 60 tablet, Rfl: 1   lacosamide (VIMPAT) 50 MG TABS tablet, Take 1 tablet (50 mg total) by mouth 2 (two) times daily., Disp: 60 tablet, Rfl: 1   losartan (COZAAR) 100 MG tablet, Take 100 mg by mouth daily., Disp: , Rfl:    metFORMIN (GLUCOPHAGE-XR) 500 MG 24 hr tablet, Take 500 mg by mouth daily with breakfast., Disp: , Rfl:    oxyCODONE (ROXICODONE) 5 MG immediate release tablet, Take 1 tablet (5 mg total) by mouth every 6 (six) hours as needed for severe pain., Disp: 20 tablet, Rfl: 0   oxyCODONE-acetaminophen (PERCOCET) 5-325 MG tablet, Take 1 tablet by mouth every 4 (four) hours as needed for severe pain., Disp: 30 tablet, Rfl: 0   oxyCODONE-acetaminophen (PERCOCET) 5-325 MG tablet, Take 1 tablet by mouth every 4 (four) hours as needed for severe  pain., Disp: 30 tablet, Rfl: 0   oxyCODONE-acetaminophen (PERCOCET) 5-325 MG tablet, Take 1 tablet by mouth every 4 (four) hours as needed for severe pain., Disp: 30 tablet, Rfl: 0   oxyCODONE-acetaminophen (PERCOCET) 5-325 MG tablet, Take 1 tablet by mouth every 4 (four) hours as needed for severe pain., Disp: 30 tablet, Rfl: 0   oxyCODONE-acetaminophen (PERCOCET) 5-325 MG tablet, Take 1 tablet by mouth every 4 (four) hours as needed for severe pain., Disp: 30 tablet, Rfl: 0   oxyCODONE-acetaminophen (PERCOCET) 5-325 MG tablet, Take 1 tablet by mouth every 4 (four) hours as needed for severe pain., Disp: 30 tablet, Rfl: 0   oxyCODONE-acetaminophen (PERCOCET) 5-325 MG tablet, Take 1 tablet by mouth every 4 (four) hours as needed for severe pain., Disp: 30 tablet, Rfl: 0  Social History   Tobacco Use  Smoking Status Former   Current packs/day: 0.00   Average packs/day: 1 pack/day for 34.0 years (34.0 ttl pk-yrs)   Types: Cigarettes   Start date: 73   Quit date: 2009   Years since quitting: 15.7  Smokeless Tobacco Never    No Known Allergies Objective:  There were no vitals filed for this visit. There is no height or weight on file to calculate BMI. Constitutional Well developed. Well nourished.  Vascular Foot warm and well  perfused. Capillary refill normal to all digits.   Neurologic Normal speech. Oriented to person, place, and time. Epicritic sensation to light touch grossly present bilaterally.  Dermatologic Skin superficially dehisced.  No exposure of hardware noted no probing down to deep bone noted.  No purulent drainage noted.  No malodor present.  Orthopedic: Tenderness to palpation noted about the surgical site.   Radiographs: 3 views of skeletally mature the right foot: Hardware was mostly removed the lateral plate.  The screw and the suture button are intact and in place Assessment:   No diagnosis found.  Plan:  Patient was evaluated and treated and all questions  answered.  S/p foot surgery right -Progressing as expected post-operatively. -XR: See above -WB Status: Weightbearing as tolerated in cam boot -Sutures: Will superficial dehiscence noted.  No exposure of hardware noted. -Medications: None -Foot redressed.  No follow-ups on file.

## 2022-11-01 ENCOUNTER — Encounter: Payer: 59 | Admitting: Podiatry

## 2022-11-07 ENCOUNTER — Telehealth: Payer: Self-pay

## 2022-11-07 NOTE — Telephone Encounter (Signed)
Blood thinner advice  received from Dr. Laureen Abrahams by fax on 11/03/22.  Patients daughter Alcario Drought has been advised to stop her mother's Plavix 2 days prior to her colonoscopy and restart 1 day prior to colonoscopy.  Erica verbalized understanding and mychart message has been sent to her mother's mychart.  Thanks,  [place], [place]

## 2022-11-22 ENCOUNTER — Ambulatory Visit (INDEPENDENT_AMBULATORY_CARE_PROVIDER_SITE_OTHER): Payer: 59 | Admitting: Podiatry

## 2022-11-22 DIAGNOSIS — Z9889 Other specified postprocedural states: Secondary | ICD-10-CM

## 2022-11-22 DIAGNOSIS — T8484XA Pain due to internal orthopedic prosthetic devices, implants and grafts, initial encounter: Secondary | ICD-10-CM

## 2022-11-22 NOTE — Progress Notes (Signed)
Subjective:  Patient ID: Terri Burnett, female    DOB: 07-10-1955,  MRN: [id number]  Chief Complaint  Patient presents with   Routine Post Op    Suture removal     DOS: 10/10/2022 Procedure: Removal of right foot hardware  67 y.o. female returns for post-op check.  Patient states that she is doing well.  Denies any other acute complaint she is a diabetic.  Denies any other acute complaints.  Noticing some drainage but overall doing okay  Review of Systems: Negative except as noted in the HPI. Denies N/V/F/Ch.  Past Medical History:  Diagnosis Date   Diabetes mellitus without complication (HCC)    Hypertension    Stroke [facility])     Current Outpatient Medications:    amLODipine (NORVASC) 10 MG tablet, Take 10 mg by mouth daily., Disp: , Rfl:    atorvastatin (LIPITOR) 40 MG tablet, Take 40 mg by mouth at bedtime. , Disp: , Rfl:    clopidogrel (PLAVIX) 75 MG tablet, Take 75 mg by mouth daily., Disp: , Rfl:    ibuprofen (ADVIL) 800 MG tablet, Take 1 tablet (800 mg total) by mouth every 6 (six) hours as needed., Disp: 60 tablet, Rfl: 1   ibuprofen (ADVIL) 800 MG tablet, Take 1 tablet (800 mg total) by mouth every 6 (six) hours as needed., Disp: 60 tablet, Rfl: 1   lacosamide (VIMPAT) 50 MG TABS tablet, Take 1 tablet (50 mg total) by mouth 2 (two) times daily., Disp: 60 tablet, Rfl: 1   losartan (COZAAR) 100 MG tablet, Take 100 mg by mouth daily., Disp: , Rfl:    metFORMIN (GLUCOPHAGE-XR) 500 MG 24 hr tablet, Take 500 mg by mouth daily with breakfast., Disp: , Rfl:    oxyCODONE (ROXICODONE) 5 MG immediate release tablet, Take 1 tablet (5 mg total) by mouth every 6 (six) hours as needed for severe pain., Disp: 20 tablet, Rfl: 0   oxyCODONE-acetaminophen (PERCOCET) 5-325 MG tablet, Take 1 tablet by mouth every 4 (four) hours as needed for severe pain., Disp: 30 tablet, Rfl: 0   oxyCODONE-acetaminophen (PERCOCET) 5-325 MG tablet, Take 1 tablet by mouth every 4 (four) hours as needed  for severe pain., Disp: 30 tablet, Rfl: 0   oxyCODONE-acetaminophen (PERCOCET) 5-325 MG tablet, Take 1 tablet by mouth every 4 (four) hours as needed for severe pain., Disp: 30 tablet, Rfl: 0   oxyCODONE-acetaminophen (PERCOCET) 5-325 MG tablet, Take 1 tablet by mouth every 4 (four) hours as needed for severe pain., Disp: 30 tablet, Rfl: 0   oxyCODONE-acetaminophen (PERCOCET) 5-325 MG tablet, Take 1 tablet by mouth every 4 (four) hours as needed for severe pain., Disp: 30 tablet, Rfl: 0   oxyCODONE-acetaminophen (PERCOCET) 5-325 MG tablet, Take 1 tablet by mouth every 4 (four) hours as needed for severe pain., Disp: 30 tablet, Rfl: 0   oxyCODONE-acetaminophen (PERCOCET) 5-325 MG tablet, Take 1 tablet by mouth every 4 (four) hours as needed for severe pain., Disp: 30 tablet, Rfl: 0  Social History   Tobacco Use  Smoking Status Former   Current packs/day: 0.00   Average packs/day: 1 pack/day for 34.0 years (34.0 ttl pk-yrs)   Types: Cigarettes   Start date: 84   Quit date: 2009   Years since quitting: 15.7  Smokeless Tobacco Never    No Known Allergies Objective:  There were no vitals filed for this visit. There is no height or weight on file to calculate BMI. Constitutional Well developed. Well nourished.  Vascular Foot warm and  well perfused. Capillary refill normal to all digits.   Neurologic Normal speech. Oriented to person, place, and time. Epicritic sensation to light touch grossly present bilaterally.  Dermatologic Skin superficially dehisced.  No exposure of hardware noted no probing down to deep bone noted.  No purulent drainage noted.  No malodor present.  Orthopedic: Tenderness to palpation noted about the surgical site.   Radiographs: 3 views of skeletally mature the right foot: Hardware was mostly removed the lateral plate.  The screw and the suture button are intact and in place Assessment:   1. Painful orthopaedic hardware (HCC)   2. Status post foot surgery      Plan:  Patient was evaluated and treated and all questions answered.  S/p foot surgery right -Progressing as expected post-operatively. -XR: See above -WB Status: Weightbearing as tolerated in cam boot -Sutures: None.  Improving superficial wound dehiscence noted continue Betadine wet-to-dry dressing.  If there is no improvement by next visit we will discuss wound care center.  No follow-ups on file.

## 2022-12-21 ENCOUNTER — Telehealth: Payer: Self-pay

## 2022-12-21 NOTE — Telephone Encounter (Signed)
Per Baxter Hire in Endo -- "I called Ms. Stoy for her pre-visit call for her colonoscopy with Dr. Allegra Lai next Wed. Nov 6. she would like to cancel this procedure for now and call later to reschedule. No real reason, just is not ready to have it done at this time. I'll take her out of the schedule. Thanks! - Baxter Hire"  [place], [place]

## 2022-12-27 ENCOUNTER — Encounter: Payer: 59 | Admitting: Podiatry

## 2022-12-27 ENCOUNTER — Ambulatory Visit (INDEPENDENT_AMBULATORY_CARE_PROVIDER_SITE_OTHER): Payer: 59 | Admitting: Podiatry

## 2022-12-27 ENCOUNTER — Encounter: Payer: Self-pay | Admitting: Podiatry

## 2022-12-27 VITALS — Ht 68.0 in | Wt 215.0 lb

## 2022-12-27 DIAGNOSIS — T8484XA Pain due to internal orthopedic prosthetic devices, implants and grafts, initial encounter: Secondary | ICD-10-CM

## 2022-12-27 DIAGNOSIS — Z9889 Other specified postprocedural states: Secondary | ICD-10-CM

## 2022-12-27 NOTE — Progress Notes (Signed)
Subjective:  Patient ID: Terri Burnett, female    DOB: 1956/02/16,  MRN: [id number]  Chief Complaint  Patient presents with   Routine Post Op    Follow up visit.    DOS: 10/10/2022 Procedure: Removal of right foot hardware  67 y.o. female returns for post-op check.  Patient states that she is doing well.  Denies any other acute complaint she is a diabetic.  Denies any other acute complaints.  Noticing some drainage but overall doing okay  Review of Systems: Negative except as noted in the HPI. Denies N/V/F/Ch.  Past Medical History:  Diagnosis Date   Diabetes mellitus without complication (HCC)    Hypertension    Stroke [facility])     Current Outpatient Medications:    amLODipine (NORVASC) 10 MG tablet, Take 10 mg by mouth daily., Disp: , Rfl:    atorvastatin (LIPITOR) 40 MG tablet, Take 40 mg by mouth at bedtime. , Disp: , Rfl:    clopidogrel (PLAVIX) 75 MG tablet, Take 75 mg by mouth daily., Disp: , Rfl:    ibuprofen (ADVIL) 800 MG tablet, Take 1 tablet (800 mg total) by mouth every 6 (six) hours as needed., Disp: 60 tablet, Rfl: 1   ibuprofen (ADVIL) 800 MG tablet, Take 1 tablet (800 mg total) by mouth every 6 (six) hours as needed., Disp: 60 tablet, Rfl: 1   lacosamide (VIMPAT) 50 MG TABS tablet, Take 1 tablet (50 mg total) by mouth 2 (two) times daily., Disp: 60 tablet, Rfl: 1   losartan (COZAAR) 100 MG tablet, Take 100 mg by mouth daily., Disp: , Rfl:    metFORMIN (GLUCOPHAGE-XR) 500 MG 24 hr tablet, Take 500 mg by mouth daily with breakfast., Disp: , Rfl:    oxyCODONE (ROXICODONE) 5 MG immediate release tablet, Take 1 tablet (5 mg total) by mouth every 6 (six) hours as needed for severe pain., Disp: 20 tablet, Rfl: 0   oxyCODONE-acetaminophen (PERCOCET) 5-325 MG tablet, Take 1 tablet by mouth every 4 (four) hours as needed for severe pain., Disp: 30 tablet, Rfl: 0   oxyCODONE-acetaminophen (PERCOCET) 5-325 MG tablet, Take 1 tablet by mouth every 4 (four) hours as needed  for severe pain., Disp: 30 tablet, Rfl: 0   oxyCODONE-acetaminophen (PERCOCET) 5-325 MG tablet, Take 1 tablet by mouth every 4 (four) hours as needed for severe pain., Disp: 30 tablet, Rfl: 0   oxyCODONE-acetaminophen (PERCOCET) 5-325 MG tablet, Take 1 tablet by mouth every 4 (four) hours as needed for severe pain., Disp: 30 tablet, Rfl: 0   oxyCODONE-acetaminophen (PERCOCET) 5-325 MG tablet, Take 1 tablet by mouth every 4 (four) hours as needed for severe pain., Disp: 30 tablet, Rfl: 0   oxyCODONE-acetaminophen (PERCOCET) 5-325 MG tablet, Take 1 tablet by mouth every 4 (four) hours as needed for severe pain., Disp: 30 tablet, Rfl: 0   oxyCODONE-acetaminophen (PERCOCET) 5-325 MG tablet, Take 1 tablet by mouth every 4 (four) hours as needed for severe pain., Disp: 30 tablet, Rfl: 0  Social History   Tobacco Use  Smoking Status Former   Current packs/day: 0.00   Average packs/day: 1 pack/day for 34.0 years (34.0 ttl pk-yrs)   Types: Cigarettes   Start date: 36   Quit date: 2009   Years since quitting: 15.8  Smokeless Tobacco Never    No Known Allergies Objective:  There were no vitals filed for this visit. Body mass index is 32.69 kg/m. Constitutional Well developed. Well nourished.  Vascular Foot warm and well perfused. Capillary refill normal  to all digits.   Neurologic Normal speech. Oriented to person, place, and time. Epicritic sensation to light touch grossly present bilaterally.  Dermatologic Skin superficially dehisced.  No exposure of hardware noted no probing down to deep bone noted.  No purulent drainage noted.  No malodor present.  Orthopedic: No further tenderness to palpation noted about the surgical site.   Radiographs: 3 views of skeletally mature the right foot: Hardware was mostly removed the lateral plate.  The screw and the suture button are intact and in place Assessment:   No diagnosis found.   Plan:  Patient was evaluated and treated and all questions  answered.  S/p foot surgery right -Clinically healed and officially discharged from my care.  The incision has healed completely.  At this time if any foot and ankle issues on future she will come back and see me.  No follow-ups on file.

## 2022-12-28 ENCOUNTER — Encounter: Admission: RE | Payer: Self-pay | Source: Home / Self Care

## 2022-12-28 ENCOUNTER — Ambulatory Visit: Admission: RE | Admit: 2022-12-28 | Payer: 59 | Source: Home / Self Care | Admitting: Gastroenterology

## 2022-12-28 SURGERY — COLONOSCOPY WITH PROPOFOL
Anesthesia: General

## 2023-01-03 ENCOUNTER — Encounter: Payer: 59 | Admitting: Podiatry

## 2023-03-21 ENCOUNTER — Encounter: Payer: Self-pay | Admitting: Family Medicine

## 2023-03-24 ENCOUNTER — Other Ambulatory Visit: Payer: Self-pay | Admitting: Family Medicine

## 2023-03-24 DIAGNOSIS — R921 Mammographic calcification found on diagnostic imaging of breast: Secondary | ICD-10-CM

## 2023-05-02 ENCOUNTER — Ambulatory Visit
Admission: RE | Admit: 2023-05-02 | Discharge: 2023-05-02 | Disposition: A | Discharge status: Home / Self Care | Payer: 59 | Source: Ambulatory Visit | Attending: Family Medicine | Admitting: Family Medicine

## 2023-05-02 DIAGNOSIS — R921 Mammographic calcification found on diagnostic imaging of breast: Secondary | ICD-10-CM | POA: Insufficient documentation

## 2023-06-29 ENCOUNTER — Ambulatory Visit (INDEPENDENT_AMBULATORY_CARE_PROVIDER_SITE_OTHER): Admitting: Podiatry

## 2023-06-29 ENCOUNTER — Telehealth: Payer: Self-pay | Admitting: Podiatry

## 2023-06-29 DIAGNOSIS — L97312 Non-pressure chronic ulcer of right ankle with fat layer exposed: Secondary | ICD-10-CM | POA: Diagnosis not present

## 2023-06-29 MED ORDER — DOXYCYCLINE HYCLATE 100 MG PO TABS: 100.0000 mg | ORAL_TABLET | Freq: Two times a day (BID) | ORAL | 0 refills | Status: AC

## 2023-06-29 MED ORDER — SILVER SULFADIAZINE 1 % EX CREA: 1.0000 | TOPICAL_CREAM | Freq: Every day | CUTANEOUS | 0 refills | Status: AC

## 2023-06-29 NOTE — Progress Notes (Signed)
 Subjective:  Patient ID: Terri Burnett, female    DOB: [birth year],  MRN: [id number]  Chief Complaint  Patient presents with   Post-op Problem    Pt stated that her foot is leaking from the incision site     68 y.o. female presents with the above complaint.  Patient presents with new opening of wound to the right ankle on the medial lateral side.  Patient states this came out of nowhere manage started in January.  She was doing good no acute issues.  There is some drainage coming out of the incision site and usually yellow in nature.  They have not done much for it denies any other acute issues.  Would like to discuss treatment options for this.   Review of Systems: Negative except as noted in the HPI. Denies N/V/F/Ch.  Past Medical History:  Diagnosis Date   Diabetes mellitus without complication (HCC)    Hypertension    Stroke [facility])     Current Outpatient Medications:    silver sulfADIAZINE (SILVADENE) 1 % cream, Apply 1 Application topically daily., Disp: 50 g, Rfl: 0   amLODipine (NORVASC) 10 MG tablet, Take 10 mg by mouth daily., Disp: , Rfl:    atorvastatin  (LIPITOR) 40 MG tablet, Take 40 mg by mouth at bedtime. , Disp: , Rfl:    clopidogrel  (PLAVIX ) 75 MG tablet, Take 75 mg by mouth daily., Disp: , Rfl:    doxycycline  (VIBRA -TABS) 100 MG tablet, Take 1 tablet (100 mg total) by mouth 2 (two) times daily., Disp: 60 tablet, Rfl: 0   ibuprofen  (ADVIL ) 800 MG tablet, Take 1 tablet (800 mg total) by mouth every 6 (six) hours as needed., Disp: 60 tablet, Rfl: 1   ibuprofen  (ADVIL ) 800 MG tablet, Take 1 tablet (800 mg total) by mouth every 6 (six) hours as needed., Disp: 60 tablet, Rfl: 1   lacosamide  (VIMPAT ) 50 MG TABS tablet, Take 1 tablet (50 mg total) by mouth 2 (two) times daily., Disp: 60 tablet, Rfl: 1   losartan (COZAAR) 100 MG tablet, Take 100 mg by mouth daily., Disp: , Rfl:    metFORMIN (GLUCOPHAGE-XR) 500 MG 24 hr tablet, Take 500 mg by mouth daily with  breakfast., Disp: , Rfl:    oxyCODONE  (ROXICODONE ) 5 MG immediate release tablet, Take 1 tablet (5 mg total) by mouth every 6 (six) hours as needed for severe pain., Disp: 20 tablet, Rfl: 0   oxyCODONE -acetaminophen  (PERCOCET) 5-325 MG tablet, Take 1 tablet by mouth every 4 (four) hours as needed for severe pain., Disp: 30 tablet, Rfl: 0   oxyCODONE -acetaminophen  (PERCOCET) 5-325 MG tablet, Take 1 tablet by mouth every 4 (four) hours as needed for severe pain., Disp: 30 tablet, Rfl: 0   oxyCODONE -acetaminophen  (PERCOCET) 5-325 MG tablet, Take 1 tablet by mouth every 4 (four) hours as needed for severe pain., Disp: 30 tablet, Rfl: 0   oxyCODONE -acetaminophen  (PERCOCET) 5-325 MG tablet, Take 1 tablet by mouth every 4 (four) hours as needed for severe pain., Disp: 30 tablet, Rfl: 0   oxyCODONE -acetaminophen  (PERCOCET) 5-325 MG tablet, Take 1 tablet by mouth every 4 (four) hours as needed for severe pain., Disp: 30 tablet, Rfl: 0   oxyCODONE -acetaminophen  (PERCOCET) 5-325 MG tablet, Take 1 tablet by mouth every 4 (four) hours as needed for severe pain., Disp: 30 tablet, Rfl: 0   oxyCODONE -acetaminophen  (PERCOCET) 5-325 MG tablet, Take 1 tablet by mouth every 4 (four) hours as needed for severe pain., Disp: 30 tablet, Rfl: 0  Social History  Tobacco Use  Smoking Status Former   Current packs/day: 0.00   Average packs/day: 1 pack/day for 34.0 years (34.0 ttl pk-yrs)   Types: Cigarettes   Start date: 11   Quit date: 2009   Years since quitting: 16.3  Smokeless Tobacco Never    No Known Allergies Objective:  There were no vitals filed for this visit. There is no height or weight on file to calculate BMI. Constitutional Well developed. Well nourished.  Vascular Dorsalis pedis pulses palpable bilaterally. Posterior tibial pulses palpable bilaterally. Capillary refill normal to all digits.  No cyanosis or clubbing noted. Pedal hair growth normal.  Neurologic Normal speech. Oriented to  person, place, and time. Epicritic sensation to light touch grossly present bilaterally.  Dermatologic 0.3 cm x 0.3 cm x 0.3 cm wound noted on medial lateral aspect of the ankle joint.  Does not probe down to bone.  Probes down to fat layer.  No purulent drainage noted.  Drainage is mostly serosanguineous.  No malodor present no clinical signs of infection noted  Orthopedic: Normal joint ROM without pain or crepitus bilaterally. No visible deformities. No bony tenderness.   Radiographs: None Assessment:  No diagnosis found. Plan:  Patient was evaluated and treated and all questions answered.  Right ankle ulceration with fat layer exposed small - All questions and concerns were discussed with the ulceration at this time given that it is not clinically infected patient will benefit from Silvadene cream to be applied at least once a day they both agree with the plan 70 cream was sent to the pharmacy.  Encouraged him to apply once a day.  They both agree with the plan - If there is no improvement we discussed Betadine wet-to-dry dressing. -  No follow-ups on file.

## 2023-06-29 NOTE — Telephone Encounter (Signed)
 PT stated that it was discussed that you would prescribe Doxycycline  but it wasn't sent to pharmacy. Pt also requesting it sent to the following pharmacy [facility] [address] [place].[phone] (Please update as preferred pharmacy)

## 2023-07-13 ENCOUNTER — Ambulatory Visit (INDEPENDENT_AMBULATORY_CARE_PROVIDER_SITE_OTHER): Admitting: Podiatry

## 2023-07-13 DIAGNOSIS — L97312 Non-pressure chronic ulcer of right ankle with fat layer exposed: Secondary | ICD-10-CM

## 2023-07-13 DIAGNOSIS — T8484XA Pain due to internal orthopedic prosthetic devices, implants and grafts, initial encounter: Secondary | ICD-10-CM

## 2023-07-13 MED ORDER — SANTYL 250 UNIT/GM EX OINT: 1.0000 | TOPICAL_OINTMENT | Freq: Every day | CUTANEOUS | 0 refills | Status: AC

## 2023-07-13 NOTE — Progress Notes (Signed)
 Subjective:  Patient ID: Terri Burnett, female    DOB: [birth year],  MRN: [id number]  Chief Complaint  Patient presents with   Foot Ulcer    68 y.o. female presents with the above complaint.  Patient presents with new opening of wound to the right ankle on the medial lateral side.  Patient states this came out of nowhere manage started in January.  She was doing good no acute issues.  There is some drainage coming out of the incision site and usually yellow in nature.  They have not done much for it denies any other acute issues.  Would like to discuss treatment options for this.   Review of Systems: Negative except as noted in the HPI. Denies N/V/F/Ch.  Past Medical History:  Diagnosis Date   Diabetes mellitus without complication (HCC)    Hypertension    Stroke [facility])     Current Outpatient Medications:    collagenase (SANTYL) 250 UNIT/GM ointment, Apply 1 Application topically daily. Wound measurement is 1 cm x 1 cm x 0.3 cm, Disp: 15 g, Rfl: 0   amLODipine (NORVASC) 10 MG tablet, Take 10 mg by mouth daily., Disp: , Rfl:    atorvastatin  (LIPITOR) 40 MG tablet, Take 40 mg by mouth at bedtime. , Disp: , Rfl:    clopidogrel  (PLAVIX ) 75 MG tablet, Take 75 mg by mouth daily., Disp: , Rfl:    doxycycline  (VIBRA -TABS) 100 MG tablet, Take 1 tablet (100 mg total) by mouth 2 (two) times daily., Disp: 60 tablet, Rfl: 0   ibuprofen  (ADVIL ) 800 MG tablet, Take 1 tablet (800 mg total) by mouth every 6 (six) hours as needed., Disp: 60 tablet, Rfl: 1   ibuprofen  (ADVIL ) 800 MG tablet, Take 1 tablet (800 mg total) by mouth every 6 (six) hours as needed., Disp: 60 tablet, Rfl: 1   lacosamide  (VIMPAT ) 50 MG TABS tablet, Take 1 tablet (50 mg total) by mouth 2 (two) times daily., Disp: 60 tablet, Rfl: 1   losartan (COZAAR) 100 MG tablet, Take 100 mg by mouth daily., Disp: , Rfl:    metFORMIN (GLUCOPHAGE-XR) 500 MG 24 hr tablet, Take 500 mg by mouth daily with breakfast., Disp: , Rfl:     oxyCODONE  (ROXICODONE ) 5 MG immediate release tablet, Take 1 tablet (5 mg total) by mouth every 6 (six) hours as needed for severe pain., Disp: 20 tablet, Rfl: 0   oxyCODONE -acetaminophen  (PERCOCET) 5-325 MG tablet, Take 1 tablet by mouth every 4 (four) hours as needed for severe pain., Disp: 30 tablet, Rfl: 0   oxyCODONE -acetaminophen  (PERCOCET) 5-325 MG tablet, Take 1 tablet by mouth every 4 (four) hours as needed for severe pain., Disp: 30 tablet, Rfl: 0   oxyCODONE -acetaminophen  (PERCOCET) 5-325 MG tablet, Take 1 tablet by mouth every 4 (four) hours as needed for severe pain., Disp: 30 tablet, Rfl: 0   oxyCODONE -acetaminophen  (PERCOCET) 5-325 MG tablet, Take 1 tablet by mouth every 4 (four) hours as needed for severe pain., Disp: 30 tablet, Rfl: 0   oxyCODONE -acetaminophen  (PERCOCET) 5-325 MG tablet, Take 1 tablet by mouth every 4 (four) hours as needed for severe pain., Disp: 30 tablet, Rfl: 0   oxyCODONE -acetaminophen  (PERCOCET) 5-325 MG tablet, Take 1 tablet by mouth every 4 (four) hours as needed for severe pain., Disp: 30 tablet, Rfl: 0   oxyCODONE -acetaminophen  (PERCOCET) 5-325 MG tablet, Take 1 tablet by mouth every 4 (four) hours as needed for severe pain., Disp: 30 tablet, Rfl: 0   silver  sulfADIAZINE  (SILVADENE ) 1 % cream,  Apply 1 Application topically daily., Disp: 50 g, Rfl: 0  Social History   Tobacco Use  Smoking Status Former   Current packs/day: 0.00   Average packs/day: 1 pack/day for 34.0 years (34.0 ttl pk-yrs)   Types: Cigarettes   Start date: 50   Quit date: 2009   Years since quitting: 16.3  Smokeless Tobacco Never    No Known Allergies Objective:  There were no vitals filed for this visit. There is no height or weight on file to calculate BMI. Constitutional Well developed. Well nourished.  Vascular Dorsalis pedis pulses palpable bilaterally. Posterior tibial pulses palpable bilaterally. Capillary refill normal to all digits.  No cyanosis or clubbing  noted. Pedal hair growth normal.  Neurologic Normal speech. Oriented to person, place, and time. Epicritic sensation to light touch grossly present bilaterally.  Dermatologic 0.3 cm x 0.3 cm x 0.3 cm wound noted on medial lateral aspect of the ankle joint.  Does not probe down to bone.  Probes down to fat layer.  No purulent drainage noted.  Drainage is mostly serosanguineous.  No malodor present no clinical signs of infection noted  Orthopedic: Normal joint ROM without pain or crepitus bilaterally. No visible deformities. No bony tenderness.   Radiographs: None Assessment:   1. Ankle ulcer, right, with fat layer exposed (HCC)   2. Painful orthopaedic hardware [facility])    Plan:  Patient was evaluated and treated and all questions answered.  Right ankle ulceration with fat layer exposed small - All questions and concerns were discussed with the ulceration at this time given that it is not clinically infected patient will benefit from Santyl.  As the wound is becoming more granular patient will benefit from sent a cream sent over sent to the pharmacy wound measurements 1 cm x 1 cm x 0.3 cm.  Encouraged him to apply once a day.  They both agree with the plan -Patient's wound does correlate with the suture button.  If there is no improvement we may have to remove the suture button in the future  No follow-ups on file.

## 2023-08-03 ENCOUNTER — Encounter: Payer: Self-pay | Admitting: Podiatry

## 2023-08-03 ENCOUNTER — Ambulatory Visit: Admitting: Podiatry

## 2023-08-03 DIAGNOSIS — S90851A Superficial foreign body, right foot, initial encounter: Secondary | ICD-10-CM | POA: Diagnosis not present

## 2023-08-03 DIAGNOSIS — L97312 Non-pressure chronic ulcer of right ankle with fat layer exposed: Secondary | ICD-10-CM

## 2023-08-03 NOTE — Progress Notes (Signed)
 Subjective:  Patient ID: Terri Burnett, female    DOB: 1955-05-20,  MRN: [id number]  Chief Complaint  Patient presents with   Wound Check    68 y.o. female presents with the above complaint.  Patient presents with follow-up of right ankle ulceration.  She states that is about the same send to help some.  She would like to discuss next treatment plan including removal of the suture button   Review of Systems: Negative except as noted in the HPI. Denies N/V/F/Ch.  Past Medical History:  Diagnosis Date   Diabetes mellitus without complication (HCC)    Hypertension    Stroke [facility])     Current Outpatient Medications:    amLODipine (NORVASC) 10 MG tablet, Take 10 mg by mouth daily., Disp: , Rfl:    atorvastatin  (LIPITOR) 40 MG tablet, Take 40 mg by mouth at bedtime. , Disp: , Rfl:    clopidogrel  (PLAVIX ) 75 MG tablet, Take 75 mg by mouth daily., Disp: , Rfl:    collagenase  (SANTYL ) 250 UNIT/GM ointment, Apply 1 Application topically daily. Wound measurement is 1 cm x 1 cm x 0.3 cm, Disp: 15 g, Rfl: 0   doxycycline  (VIBRA -TABS) 100 MG tablet, Take 1 tablet (100 mg total) by mouth 2 (two) times daily., Disp: 60 tablet, Rfl: 0   ibuprofen  (ADVIL ) 800 MG tablet, Take 1 tablet (800 mg total) by mouth every 6 (six) hours as needed., Disp: 60 tablet, Rfl: 1   ibuprofen  (ADVIL ) 800 MG tablet, Take 1 tablet (800 mg total) by mouth every 6 (six) hours as needed., Disp: 60 tablet, Rfl: 1   lacosamide  (VIMPAT ) 50 MG TABS tablet, Take 1 tablet (50 mg total) by mouth 2 (two) times daily., Disp: 60 tablet, Rfl: 1   losartan (COZAAR) 100 MG tablet, Take 100 mg by mouth daily., Disp: , Rfl:    metFORMIN (GLUCOPHAGE-XR) 500 MG 24 hr tablet, Take 500 mg by mouth daily with breakfast., Disp: , Rfl:    oxyCODONE  (ROXICODONE ) 5 MG immediate release tablet, Take 1 tablet (5 mg total) by mouth every 6 (six) hours as needed for severe pain., Disp: 20 tablet, Rfl: 0   oxyCODONE -acetaminophen  (PERCOCET)  5-325 MG tablet, Take 1 tablet by mouth every 4 (four) hours as needed for severe pain., Disp: 30 tablet, Rfl: 0   oxyCODONE -acetaminophen  (PERCOCET) 5-325 MG tablet, Take 1 tablet by mouth every 4 (four) hours as needed for severe pain., Disp: 30 tablet, Rfl: 0   oxyCODONE -acetaminophen  (PERCOCET) 5-325 MG tablet, Take 1 tablet by mouth every 4 (four) hours as needed for severe pain., Disp: 30 tablet, Rfl: 0   oxyCODONE -acetaminophen  (PERCOCET) 5-325 MG tablet, Take 1 tablet by mouth every 4 (four) hours as needed for severe pain., Disp: 30 tablet, Rfl: 0   oxyCODONE -acetaminophen  (PERCOCET) 5-325 MG tablet, Take 1 tablet by mouth every 4 (four) hours as needed for severe pain., Disp: 30 tablet, Rfl: 0   oxyCODONE -acetaminophen  (PERCOCET) 5-325 MG tablet, Take 1 tablet by mouth every 4 (four) hours as needed for severe pain., Disp: 30 tablet, Rfl: 0   oxyCODONE -acetaminophen  (PERCOCET) 5-325 MG tablet, Take 1 tablet by mouth every 4 (four) hours as needed for severe pain., Disp: 30 tablet, Rfl: 0   silver  sulfADIAZINE  (SILVADENE ) 1 % cream, Apply 1 Application topically daily., Disp: 50 g, Rfl: 0  Social History   Tobacco Use  Smoking Status Former   Current packs/day: 0.00   Average packs/day: 1 pack/day for 34.0 years (34.0 ttl pk-yrs)  Types: Cigarettes   Start date: 28   Quit date: 2009   Years since quitting: 16.4  Smokeless Tobacco Never    No Known Allergies Objective:  There were no vitals filed for this visit. There is no height or weight on file to calculate BMI. Constitutional Well developed. Well nourished.  Vascular Dorsalis pedis pulses palpable bilaterally. Posterior tibial pulses palpable bilaterally. Capillary refill normal to all digits.  No cyanosis or clubbing noted. Pedal hair growth normal.  Neurologic Normal speech. Oriented to person, place, and time. Epicritic sensation to light touch grossly present bilaterally.  Dermatologic 0.3 cm x 0.3 cm x 0.3 cm  wound noted on medial lateral aspect of the ankle joint.  Does not probe down to bone.  Probes down to fat layer.  No purulent drainage noted.  Drainage is mostly serosanguineous.  No malodor present no clinical signs of infection noted  Orthopedic: Normal joint ROM without pain or crepitus bilaterally. No visible deformities. No bony tenderness.   Radiographs: None Assessment:   No diagnosis found.  Plan:  Patient was evaluated and treated and all questions answered.  Right ankle ulceration with fat layer exposed small - All questions and concerns were discussed with the ulceration at this time given that it is not clinically infected patient will benefit from Santyl .  Continue using Santyl  cream.  Wound measurement is about the same at 0.3 cm x 0.3 cm x 0.3 cm. - Removal of foreign body - Skin prepped with Betadine.  One-to-one mixture of half percent lidocaine  plain was injected 10 cc in a V-block fashion around the ulcer site.  Using hemostat, the suture button button was palpated and was removed in standard technique no complication noted.  Suture material was also removed.  No complication noted no clinical signs of infection noted.  No follow-ups on file.

## 2023-08-04 ENCOUNTER — Other Ambulatory Visit: Payer: Self-pay | Admitting: Podiatry

## 2023-08-04 MED ORDER — TRAMADOL HCL 50 MG PO TABS: 50.0000 mg | ORAL_TABLET | Freq: Three times a day (TID) | ORAL | 0 refills | Status: AC | PRN

## 2023-09-05 ENCOUNTER — Ambulatory Visit (INDEPENDENT_AMBULATORY_CARE_PROVIDER_SITE_OTHER): Admitting: Podiatry

## 2023-09-05 DIAGNOSIS — L97312 Non-pressure chronic ulcer of right ankle with fat layer exposed: Secondary | ICD-10-CM | POA: Diagnosis not present

## 2023-09-05 NOTE — Progress Notes (Signed)
 Subjective:  Patient ID: Terri Burnett, female    DOB: 11-17-1955,  MRN: [id number]  Chief Complaint  Patient presents with   Wound Check    Pt stated that everything is going well     68 y.o. female presents with the above complaint.  Patient presents with follow-up of right ankle ulceration.  She states that is about the same send to help some.  She would like to discuss next treatment plan including removal of the suture button   Review of Systems: Negative except as noted in the HPI. Denies N/V/F/Ch.  Past Medical History:  Diagnosis Date   Diabetes mellitus without complication (HCC)    Hypertension    Stroke [facility])     Current Outpatient Medications:    amLODipine (NORVASC) 10 MG tablet, Take 10 mg by mouth daily., Disp: , Rfl:    atorvastatin  (LIPITOR) 40 MG tablet, Take 40 mg by mouth at bedtime. , Disp: , Rfl:    clopidogrel  (PLAVIX ) 75 MG tablet, Take 75 mg by mouth daily., Disp: , Rfl:    collagenase  (SANTYL ) 250 UNIT/GM ointment, Apply 1 Application topically daily. Wound measurement is 1 cm x 1 cm x 0.3 cm, Disp: 15 g, Rfl: 0   doxycycline  (VIBRA -TABS) 100 MG tablet, Take 1 tablet (100 mg total) by mouth 2 (two) times daily., Disp: 60 tablet, Rfl: 0   ibuprofen  (ADVIL ) 800 MG tablet, Take 1 tablet (800 mg total) by mouth every 6 (six) hours as needed., Disp: 60 tablet, Rfl: 1   ibuprofen  (ADVIL ) 800 MG tablet, Take 1 tablet (800 mg total) by mouth every 6 (six) hours as needed., Disp: 60 tablet, Rfl: 1   lacosamide  (VIMPAT ) 50 MG TABS tablet, Take 1 tablet (50 mg total) by mouth 2 (two) times daily., Disp: 60 tablet, Rfl: 1   losartan (COZAAR) 100 MG tablet, Take 100 mg by mouth daily., Disp: , Rfl:    metFORMIN (GLUCOPHAGE-XR) 500 MG 24 hr tablet, Take 500 mg by mouth daily with breakfast., Disp: , Rfl:    oxyCODONE  (ROXICODONE ) 5 MG immediate release tablet, Take 1 tablet (5 mg total) by mouth every 6 (six) hours as needed for severe pain., Disp: 20 tablet,  Rfl: 0   oxyCODONE -acetaminophen  (PERCOCET) 5-325 MG tablet, Take 1 tablet by mouth every 4 (four) hours as needed for severe pain., Disp: 30 tablet, Rfl: 0   oxyCODONE -acetaminophen  (PERCOCET) 5-325 MG tablet, Take 1 tablet by mouth every 4 (four) hours as needed for severe pain., Disp: 30 tablet, Rfl: 0   oxyCODONE -acetaminophen  (PERCOCET) 5-325 MG tablet, Take 1 tablet by mouth every 4 (four) hours as needed for severe pain., Disp: 30 tablet, Rfl: 0   oxyCODONE -acetaminophen  (PERCOCET) 5-325 MG tablet, Take 1 tablet by mouth every 4 (four) hours as needed for severe pain., Disp: 30 tablet, Rfl: 0   oxyCODONE -acetaminophen  (PERCOCET) 5-325 MG tablet, Take 1 tablet by mouth every 4 (four) hours as needed for severe pain., Disp: 30 tablet, Rfl: 0   oxyCODONE -acetaminophen  (PERCOCET) 5-325 MG tablet, Take 1 tablet by mouth every 4 (four) hours as needed for severe pain., Disp: 30 tablet, Rfl: 0   oxyCODONE -acetaminophen  (PERCOCET) 5-325 MG tablet, Take 1 tablet by mouth every 4 (four) hours as needed for severe pain., Disp: 30 tablet, Rfl: 0   silver  sulfADIAZINE  (SILVADENE ) 1 % cream, Apply 1 Application topically daily., Disp: 50 g, Rfl: 0  Social History   Tobacco Use  Smoking Status Former   Current packs/day: 0.00  Average packs/day: 1 pack/day for 34.0 years (34.0 ttl pk-yrs)   Types: Cigarettes   Start date: 40   Quit date: 2009   Years since quitting: 16.5  Smokeless Tobacco Never    No Known Allergies Objective:  There were no vitals filed for this visit. There is no height or weight on file to calculate BMI. Constitutional Well developed. Well nourished.  Vascular Dorsalis pedis pulses palpable bilaterally. Posterior tibial pulses palpable bilaterally. Capillary refill normal to all digits.  No cyanosis or clubbing noted. Pedal hair growth normal.  Neurologic Normal speech. Oriented to person, place, and time. Epicritic sensation to light touch grossly present  bilaterally.  Dermatologic 0.3 cm x 0.3 cm x 0.3 cm wound noted on medial lateral aspect of the ankle joint.  Does not probe down to bone.  Probes down to fat layer.  No purulent drainage noted.  Drainage is mostly serosanguineous.  No malodor present no clinical signs of infection noted  Orthopedic: Normal joint ROM without pain or crepitus bilaterally. No visible deformities. No bony tenderness.   Radiographs: None Assessment:   1. Ankle ulcer, right, with fat layer exposed (HCC)     Plan:  Patient was evaluated and treated and all questions answered.  Right ankle ulceration with fat layer exposed small - All questions and concerns were discussed with the ulceration at this time given that it is not clinically infected patient will benefit from Santyl .  Continue using Santyl  cream.  Wound measurement is about the same at 0.1 cm x 0.1 cm x 0.1 cm - Removal of foreign body has helped assist in closing the wound.  Wound measurement is very small she will continue applying Santyl  until completely closed.  No follow-ups on file.

## 2023-09-18 ENCOUNTER — Other Ambulatory Visit: Payer: Self-pay | Admitting: Family Medicine

## 2023-09-18 DIAGNOSIS — R921 Mammographic calcification found on diagnostic imaging of breast: Secondary | ICD-10-CM

## 2023-09-18 DIAGNOSIS — Z1231 Encounter for screening mammogram for malignant neoplasm of breast: Secondary | ICD-10-CM

## 2023-10-17 ENCOUNTER — Ambulatory Visit (INDEPENDENT_AMBULATORY_CARE_PROVIDER_SITE_OTHER): Admitting: Podiatry

## 2023-10-17 ENCOUNTER — Ambulatory Visit: Admitting: Podiatry

## 2023-10-17 DIAGNOSIS — M7751 Other enthesopathy of right foot: Secondary | ICD-10-CM

## 2023-10-17 DIAGNOSIS — L97312 Non-pressure chronic ulcer of right ankle with fat layer exposed: Secondary | ICD-10-CM

## 2023-10-17 NOTE — Progress Notes (Signed)
 Subjective:  Patient ID: Terri Burnett, female    DOB: Aug 02, 1955,  MRN: [id number]  Chief Complaint  Patient presents with   Ankle ulcer, right, with fat layer exposed    Pt stated that she is doing okay she stated that she has some swelling but no pain or discomfort     68 y.o. female presents with the above complaint.  Patient presents with follow-up of right ankle ulceration.  She states is getting better is healed.  She also has some ankle pain that she wanted to discuss treatment options for.  Denies any other acute complaints   Review of Systems: Negative except as noted in the HPI. Denies N/V/F/Ch.  Past Medical History:  Diagnosis Date   Diabetes mellitus without complication (HCC)    Hypertension    Stroke [facility])     Current Outpatient Medications:    amLODipine (NORVASC) 10 MG tablet, Take 10 mg by mouth daily., Disp: , Rfl:    atorvastatin  (LIPITOR) 40 MG tablet, Take 40 mg by mouth at bedtime. , Disp: , Rfl:    clopidogrel  (PLAVIX ) 75 MG tablet, Take 75 mg by mouth daily., Disp: , Rfl:    collagenase  (SANTYL ) 250 UNIT/GM ointment, Apply 1 Application topically daily. Wound measurement is 1 cm x 1 cm x 0.3 cm, Disp: 15 g, Rfl: 0   doxycycline  (VIBRA -TABS) 100 MG tablet, Take 1 tablet (100 mg total) by mouth 2 (two) times daily., Disp: 60 tablet, Rfl: 0   ibuprofen  (ADVIL ) 800 MG tablet, Take 1 tablet (800 mg total) by mouth every 6 (six) hours as needed., Disp: 60 tablet, Rfl: 1   ibuprofen  (ADVIL ) 800 MG tablet, Take 1 tablet (800 mg total) by mouth every 6 (six) hours as needed., Disp: 60 tablet, Rfl: 1   lacosamide  (VIMPAT ) 50 MG TABS tablet, Take 1 tablet (50 mg total) by mouth 2 (two) times daily., Disp: 60 tablet, Rfl: 1   losartan (COZAAR) 100 MG tablet, Take 100 mg by mouth daily., Disp: , Rfl:    metFORMIN (GLUCOPHAGE-XR) 500 MG 24 hr tablet, Take 500 mg by mouth daily with breakfast., Disp: , Rfl:    oxyCODONE  (ROXICODONE ) 5 MG immediate release tablet,  Take 1 tablet (5 mg total) by mouth every 6 (six) hours as needed for severe pain., Disp: 20 tablet, Rfl: 0   oxyCODONE -acetaminophen  (PERCOCET) 5-325 MG tablet, Take 1 tablet by mouth every 4 (four) hours as needed for severe pain., Disp: 30 tablet, Rfl: 0   oxyCODONE -acetaminophen  (PERCOCET) 5-325 MG tablet, Take 1 tablet by mouth every 4 (four) hours as needed for severe pain., Disp: 30 tablet, Rfl: 0   oxyCODONE -acetaminophen  (PERCOCET) 5-325 MG tablet, Take 1 tablet by mouth every 4 (four) hours as needed for severe pain., Disp: 30 tablet, Rfl: 0   oxyCODONE -acetaminophen  (PERCOCET) 5-325 MG tablet, Take 1 tablet by mouth every 4 (four) hours as needed for severe pain., Disp: 30 tablet, Rfl: 0   oxyCODONE -acetaminophen  (PERCOCET) 5-325 MG tablet, Take 1 tablet by mouth every 4 (four) hours as needed for severe pain., Disp: 30 tablet, Rfl: 0   oxyCODONE -acetaminophen  (PERCOCET) 5-325 MG tablet, Take 1 tablet by mouth every 4 (four) hours as needed for severe pain., Disp: 30 tablet, Rfl: 0   oxyCODONE -acetaminophen  (PERCOCET) 5-325 MG tablet, Take 1 tablet by mouth every 4 (four) hours as needed for severe pain., Disp: 30 tablet, Rfl: 0   silver  sulfADIAZINE  (SILVADENE ) 1 % cream, Apply 1 Application topically daily., Disp: 50 g, Rfl:  0  Social History   Tobacco Use  Smoking Status Former   Current packs/day: 0.00   Average packs/day: 1 pack/day for 34.0 years (34.0 ttl pk-yrs)   Types: Cigarettes   Start date: 35   Quit date: 2009   Years since quitting: 16.6  Smokeless Tobacco Never    No Known Allergies Objective:  There were no vitals filed for this visit. There is no height or weight on file to calculate BMI. Constitutional Well developed. Well nourished.  Vascular Dorsalis pedis pulses palpable bilaterally. Posterior tibial pulses palpable bilaterally. Capillary refill normal to all digits.  No cyanosis or clubbing noted. Pedal hair growth normal.  Neurologic Normal  speech. Oriented to person, place, and time. Epicritic sensation to light touch grossly present bilaterally.  Dermatologic No further ulceration noted.  Skin has completely epithelialized.  Orthopedic: Pain on palpation to the anterior ankle pain with range of motion of the ankle joint no crepitus noted.  Good range of motion noted of the ankle joint   Radiographs: None Assessment:   1. Ankle ulcer, right, with fat layer exposed (HCC)   2. Capsulitis of ankle, right      Plan:  Patient was evaluated and treated and all questions answered.  Right ankle ulceration with fat layer exposed small - Clinically healed and officially discharged from the care of any foot and ankle issues in the future she will come back and see me.  I discussed with the prevention technique shoe gear modification she states understanding I also encouraged compression socks for some swelling.  Right ankle capsulitis -A steroid injection was performed at right ankle joint using 1% plain Lidocaine  and 10 mg of Kenalog. This was well tolerated.   No follow-ups on file.

## 2023-11-28 ENCOUNTER — Ambulatory Visit
Admission: RE | Admit: 2023-11-28 | Discharge: 2023-11-28 | Disposition: A | Discharge status: Home / Self Care | Source: Ambulatory Visit | Attending: Family Medicine | Admitting: Family Medicine

## 2023-11-28 DIAGNOSIS — R921 Mammographic calcification found on diagnostic imaging of breast: Secondary | ICD-10-CM | POA: Insufficient documentation

## 2023-11-28 DIAGNOSIS — Z1231 Encounter for screening mammogram for malignant neoplasm of breast: Secondary | ICD-10-CM | POA: Diagnosis present

## 2024-03-15 ENCOUNTER — Other Ambulatory Visit: Payer: Self-pay | Admitting: Family Medicine

## 2024-03-15 DIAGNOSIS — Z1231 Encounter for screening mammogram for malignant neoplasm of breast: Secondary | ICD-10-CM

## 2024-03-15 DIAGNOSIS — R921 Mammographic calcification found on diagnostic imaging of breast: Secondary | ICD-10-CM
